# Patient Record
Sex: Male | Born: 1950 | Race: Black or African American | Hispanic: No | Smoking: Former smoker
Health system: Southern US, Community
[De-identification: ages and names within clinical notes are randomized; demographics above are authoritative.]

## PROBLEM LIST (undated history)

## (undated) DIAGNOSIS — E119 Type 2 diabetes mellitus without complications: Secondary | ICD-10-CM

## (undated) DIAGNOSIS — K219 Gastro-esophageal reflux disease without esophagitis: Secondary | ICD-10-CM

## (undated) DIAGNOSIS — G709 Myoneural disorder, unspecified: Secondary | ICD-10-CM

## (undated) DIAGNOSIS — M1990 Unspecified osteoarthritis, unspecified site: Secondary | ICD-10-CM

## (undated) DIAGNOSIS — K862 Cyst of pancreas: Secondary | ICD-10-CM

## (undated) DIAGNOSIS — E785 Hyperlipidemia, unspecified: Secondary | ICD-10-CM

## (undated) DIAGNOSIS — R0600 Dyspnea, unspecified: Secondary | ICD-10-CM

## (undated) DIAGNOSIS — E291 Testicular hypofunction: Secondary | ICD-10-CM

## (undated) DIAGNOSIS — D649 Anemia, unspecified: Secondary | ICD-10-CM

## (undated) DIAGNOSIS — G4730 Sleep apnea, unspecified: Secondary | ICD-10-CM

## (undated) DIAGNOSIS — I10 Essential (primary) hypertension: Secondary | ICD-10-CM

## (undated) DIAGNOSIS — T7840XA Allergy, unspecified, initial encounter: Secondary | ICD-10-CM

## (undated) DIAGNOSIS — G8929 Other chronic pain: Secondary | ICD-10-CM

## (undated) DIAGNOSIS — F333 Major depressive disorder, recurrent, severe with psychotic symptoms: Secondary | ICD-10-CM

## (undated) HISTORY — PX: CATARACT EXTRACTION, BILATERAL: SHX1313

## (undated) HISTORY — DX: Cyst of pancreas: K86.2

## (undated) HISTORY — PX: CHOLECYSTECTOMY: SHX55

## (undated) HISTORY — DX: Testicular hypofunction: E29.1

## (undated) HISTORY — PX: OTHER SURGICAL HISTORY: SHX169

## (undated) HISTORY — DX: Allergy, unspecified, initial encounter: T78.40XA

## (undated) HISTORY — DX: Other chronic pain: G89.29

## (undated) HISTORY — PX: TONSILLECTOMY: SUR1361

## (undated) HISTORY — DX: Essential (primary) hypertension: I10

## (undated) HISTORY — DX: Gastro-esophageal reflux disease without esophagitis: K21.9

## (undated) HISTORY — DX: Type 2 diabetes mellitus without complications: E11.9

## (undated) HISTORY — PX: MENISCUS REPAIR: SHX5179

## (undated) HISTORY — DX: Major depressive disorder, recurrent, severe with psychotic symptoms: F33.3

## (undated) HISTORY — DX: Hyperlipidemia, unspecified: E78.5

---

## 1998-05-05 ENCOUNTER — Ambulatory Visit (HOSPITAL_COMMUNITY): Admission: RE | Admit: 1998-05-05 | Discharge: 1998-05-05 | Payer: Self-pay | Admitting: Cardiology

## 1998-05-20 ENCOUNTER — Ambulatory Visit (HOSPITAL_COMMUNITY): Admission: RE | Admit: 1998-05-20 | Discharge: 1998-05-20 | Payer: Self-pay | Admitting: Cardiology

## 1998-10-12 ENCOUNTER — Encounter: Admission: RE | Admit: 1998-10-12 | Discharge: 1998-10-12 | Payer: Self-pay | Admitting: *Deleted

## 2007-01-03 ENCOUNTER — Encounter: Admission: RE | Admit: 2007-01-03 | Discharge: 2007-01-03 | Payer: Self-pay | Admitting: Orthopedic Surgery

## 2011-11-04 ENCOUNTER — Emergency Department: Payer: Self-pay | Admitting: *Deleted

## 2016-02-29 ENCOUNTER — Other Ambulatory Visit: Payer: Self-pay | Admitting: General Surgery

## 2016-02-29 DIAGNOSIS — D3A00 Benign carcinoid tumor of unspecified site: Secondary | ICD-10-CM

## 2016-06-13 ENCOUNTER — Ambulatory Visit
Admission: RE | Admit: 2016-06-13 | Discharge: 2016-06-13 | Disposition: A | Discharge status: Home / Self Care | Payer: No Typology Code available for payment source | Source: Ambulatory Visit | Attending: General Surgery | Admitting: General Surgery

## 2016-06-13 ENCOUNTER — Other Ambulatory Visit: Payer: Self-pay

## 2016-06-13 DIAGNOSIS — D3A00 Benign carcinoid tumor of unspecified site: Secondary | ICD-10-CM

## 2016-06-13 MED ORDER — IOPAMIDOL (ISOVUE-300) INJECTION 61%
125.0000 mL | Freq: Once | INTRAVENOUS | Status: AC | PRN
  Administered 2016-06-13: 125 mL via INTRAVENOUS

## 2016-06-29 ENCOUNTER — Other Ambulatory Visit: Payer: Self-pay | Admitting: General Surgery

## 2016-06-29 DIAGNOSIS — D3A019 Benign carcinoid tumor of the small intestine, unspecified portion: Secondary | ICD-10-CM

## 2016-10-03 ENCOUNTER — Other Ambulatory Visit: Payer: Self-pay | Admitting: General Surgery

## 2016-10-03 DIAGNOSIS — I10 Essential (primary) hypertension: Secondary | ICD-10-CM

## 2016-10-03 DIAGNOSIS — C7A8 Other malignant neuroendocrine tumors: Secondary | ICD-10-CM

## 2016-10-03 DIAGNOSIS — D3A019 Benign carcinoid tumor of the small intestine, unspecified portion: Secondary | ICD-10-CM

## 2016-12-28 ENCOUNTER — Ambulatory Visit
Admission: RE | Admit: 2016-12-28 | Discharge: 2016-12-28 | Disposition: A | Discharge status: Home / Self Care | Payer: No Typology Code available for payment source | Source: Ambulatory Visit | Attending: General Surgery | Admitting: General Surgery

## 2016-12-28 ENCOUNTER — Other Ambulatory Visit: Payer: No Typology Code available for payment source

## 2016-12-28 DIAGNOSIS — I10 Essential (primary) hypertension: Secondary | ICD-10-CM

## 2016-12-28 DIAGNOSIS — D3A019 Benign carcinoid tumor of the small intestine, unspecified portion: Secondary | ICD-10-CM

## 2016-12-28 DIAGNOSIS — C7A8 Other malignant neuroendocrine tumors: Secondary | ICD-10-CM

## 2016-12-28 MED ORDER — IOPAMIDOL (ISOVUE-300) INJECTION 61%: 125.0000 mL | Freq: Once | INTRAVENOUS | Status: DC | PRN

## 2017-01-23 ENCOUNTER — Other Ambulatory Visit: Payer: Self-pay | Admitting: General Surgery

## 2017-01-23 DIAGNOSIS — D3A019 Benign carcinoid tumor of the small intestine, unspecified portion: Secondary | ICD-10-CM

## 2017-01-23 DIAGNOSIS — C7A8 Other malignant neuroendocrine tumors: Secondary | ICD-10-CM

## 2017-02-06 ENCOUNTER — Other Ambulatory Visit: Payer: No Typology Code available for payment source

## 2017-04-03 ENCOUNTER — Ambulatory Visit
Admission: RE | Admit: 2017-04-03 | Discharge: 2017-04-03 | Disposition: A | Discharge status: Home / Self Care | Payer: No Typology Code available for payment source | Source: Ambulatory Visit | Attending: General Surgery | Admitting: General Surgery

## 2017-04-03 DIAGNOSIS — D3A019 Benign carcinoid tumor of the small intestine, unspecified portion: Secondary | ICD-10-CM

## 2017-04-03 DIAGNOSIS — C7A8 Other malignant neuroendocrine tumors: Secondary | ICD-10-CM

## 2017-04-03 MED ORDER — IOPAMIDOL (ISOVUE-300) INJECTION 61%
125.0000 mL | Freq: Once | INTRAVENOUS | Status: AC | PRN
  Administered 2017-04-03: 125 mL via INTRAVENOUS

## 2017-04-17 ENCOUNTER — Other Ambulatory Visit: Payer: Self-pay | Admitting: General Surgery

## 2017-04-17 DIAGNOSIS — C7A8 Other malignant neuroendocrine tumors: Secondary | ICD-10-CM

## 2017-04-26 ENCOUNTER — Other Ambulatory Visit: Payer: No Typology Code available for payment source

## 2017-05-05 ENCOUNTER — Emergency Department
Admission: EM | Admit: 2017-05-05 | Discharge: 2017-05-05 | Disposition: A | Discharge status: Home / Self Care | Payer: Medicare Other | Attending: Emergency Medicine | Admitting: Emergency Medicine

## 2017-05-05 DIAGNOSIS — Y9289 Other specified places as the place of occurrence of the external cause: Secondary | ICD-10-CM | POA: Insufficient documentation

## 2017-05-05 DIAGNOSIS — L0390 Cellulitis, unspecified: Secondary | ICD-10-CM

## 2017-05-05 DIAGNOSIS — Y939 Activity, unspecified: Secondary | ICD-10-CM | POA: Diagnosis not present

## 2017-05-05 DIAGNOSIS — L03312 Cellulitis of back [any part except buttock]: Secondary | ICD-10-CM | POA: Insufficient documentation

## 2017-05-05 DIAGNOSIS — W57XXXA Bitten or stung by nonvenomous insect and other nonvenomous arthropods, initial encounter: Secondary | ICD-10-CM | POA: Diagnosis not present

## 2017-05-05 DIAGNOSIS — S20469A Insect bite (nonvenomous) of unspecified back wall of thorax, initial encounter: Secondary | ICD-10-CM | POA: Diagnosis present

## 2017-05-05 DIAGNOSIS — Y999 Unspecified external cause status: Secondary | ICD-10-CM | POA: Insufficient documentation

## 2017-05-05 MED ORDER — DOXYCYCLINE HYCLATE 50 MG PO CAPS: 100.0000 mg | ORAL_CAPSULE | Freq: Two times a day (BID) | ORAL | 0 refills | Status: AC

## 2017-05-05 NOTE — ED Provider Notes (Signed)
[facility] Emergency Department Provider Note  ____________________________________________  Time seen: Approximately 5:10 PM  I have reviewed the triage vital signs and the nursing notes.   HISTORY  Chief Complaint Tick removal    HPI Brian Vazquez is a 66 y.o. male that presents to emergency department with tick on his back. Patient is not sure how long the tick has been there. His daughter saw the tick on his back and says that it's really red around the tick. He has been doing a lot of outdoor work with the nice weather. He denies fever, shortness of breath, chest pain, nausea, vomiting, abdominal pain.   No past medical history on file.  There are no active problems to display for this patient.   No past surgical history on file.  Prior to Admission medications   Medication Sig Start Date End Date Taking? Authorizing Provider  doxycycline (VIBRAMYCIN) 50 MG capsule Take 2 capsules (100 mg total) by mouth 2 (two) times daily. 05/05/17 05/15/17  Laban Emperor, PA-C    Allergies Patient has no known allergies.  No family history on file.  Social History Social History  Substance Use Topics  . Smoking status: Not on file  . Smokeless tobacco: Not on file  . Alcohol use Not on file     Review of Systems  Constitutional: No fever/chills Cardiovascular: No chest pain. Respiratory: No SOB. Gastrointestinal: No abdominal pain.  No nausea, no vomiting.  Musculoskeletal: Negative for musculoskeletal pain. Skin: Negative for abrasions, lacerations, ecchymosis. Positive for rash. Neurological: Negative for headaches, numbness or tingling   ____________________________________________   PHYSICAL EXAM:  VITAL SIGNS: ED Triage Vitals [05/05/17 1617]  Enc Vitals Group     BP (!) 141/89     Pulse Rate 95     Resp 18     Temp 97.9 F (36.6 C)     Temp Source Oral     SpO2 98 %     Weight (!) 302 lb (137 kg)     Height 6\' 6"  (1.981  m)     Head Circumference      Peak Flow      Pain Score      Pain Loc      Pain Edu?      Excl. in [place]?      Constitutional: Alert and oriented. Well appearing and in no acute distress. Eyes: Conjunctivae are normal. PERRL. EOMI. Head: Atraumatic. ENT:      Ears:      Nose: No congestion/rhinnorhea.      Mouth/Throat: Mucous membranes are moist.  Neck: No stridor.  Cardiovascular: Normal rate, regular rhythm.  Good peripheral circulation. Respiratory: Normal respiratory effort without tachypnea or retractions. Lungs CTAB. Good air entry to the bases with no decreased or absent breath sounds. Gastrointestinal: Bowel sounds 4 quadrants. Soft and nontender to palpation.  Musculoskeletal: Full range of motion to all extremities. No gross deformities appreciated. Neurologic:  Normal speech and language. No gross focal neurologic deficits are appreciated.  Skin:  Skin is warm, dry and intact. Tick attached to mid right back with 2 inches of surrounding erythema.   ____________________________________________   LABS (all labs ordered are listed, but only abnormal results are displayed)  Labs Reviewed - No data to display ____________________________________________  EKG   ____________________________________________  RADIOLOGY No results found.  ____________________________________________    PROCEDURES  Procedure(s) performed:    Procedures  Tick was removed with forceps.  Medications - No data to  display   ____________________________________________   INITIAL IMPRESSION / ASSESSMENT AND PLAN / ED COURSE  Pertinent labs & imaging results that were available during my care of the patient were reviewed by me and considered in my medical decision making (see chart for details).  Review of the [place] CSRS was performed in accordance of the [place] prior to dispensing any controlled drugs.   Patient's diagnosis is consistent with tick bite and surrounding  cellulitis. Vital signs and exam are reassuring. No appearance of erythema migrans. Tick was removed in ED with forceps. Patient will be discharged home with prescriptions for doxycycline. Patient is to follow up with PCP as directed. Patient is given ED precautions to return to the ED for any worsening or new symptoms.     ____________________________________________  FINAL CLINICAL IMPRESSION(S) / ED DIAGNOSES  Final diagnoses:  Tick bite with subsequent removal of tick  Wound cellulitis      NEW MEDICATIONS STARTED DURING THIS VISIT:  Discharge Medication List as of 05/05/2017  5:17 PM    START taking these medications   Details  doxycycline (VIBRAMYCIN) 50 MG capsule Take 2 capsules (100 mg total) by mouth 2 (two) times daily., Starting Fri 05/05/2017, Until Mon 05/15/2017, Print            This chart was dictated using voice recognition software/Dragon. Despite best efforts to proofread, errors can occur which can change the meaning. Any change was purely unintentional.    Laban Emperor, PA-C 05/05/17 1817    Harvest Dark, MD 05/05/17 [phone]

## 2017-05-05 NOTE — ED Triage Notes (Signed)
Pt states found tick on back today while visiting a family member here today.

## 2017-05-05 NOTE — ED Notes (Signed)
See triage note.   States he discovered a ticka nd is having a hard time removing it

## 2017-08-23 ENCOUNTER — Other Ambulatory Visit: Payer: No Typology Code available for payment source

## 2018-04-04 IMAGING — CT CT CHEST W/ CM
2 of 5 series · 10 of 36 positions shown, 17 images · IV contrast (READICAT/WATER & [ID] ISOVUE 300)
Comparison: None.

CLINICAL DATA: 65-year-old male with history of carcinoid tumor,
status post surgical resection 01/26/2016. Former smoker (quit 30
years ago).

EXAM:
CT CHEST, ABDOMEN, AND PELVIS WITH CONTRAST
TECHNIQUE: Multidetector CT imaging of the chest, abdomen and pelvis was
performed following the standard protocol during bolus
administration of intravenous contrast.
CONTRAST:  125mL 7UPH9L-FMM IOPAMIDOL (7UPH9L-FMM) INJECTION 61%

[Series 601: coronal body · coronal · 1.41mm/px · 1 of 147 slices shown, 2 images]
[im 49/147  soft-tissue]
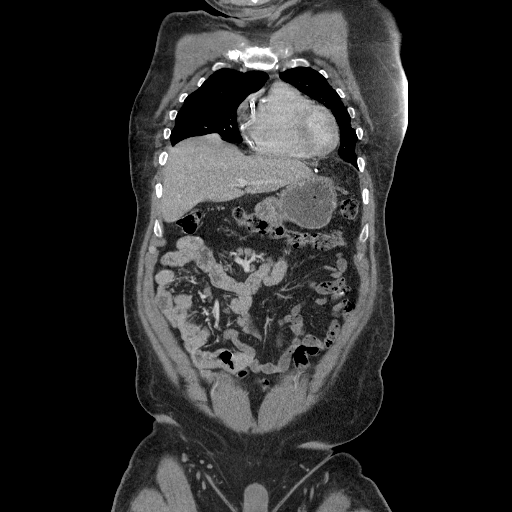
[im 49/147  bone]
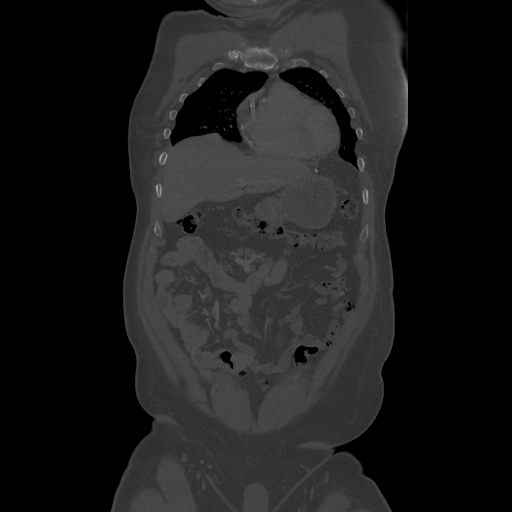

[Series 602: sagittal body · sagittal · 1.41mm/px · 9 of 181 slices shown, 15 images]
[im 17/181  soft-tissue]
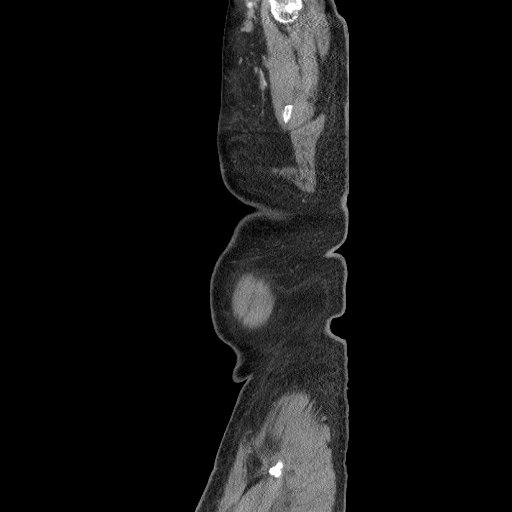
[im 17/181  lung]
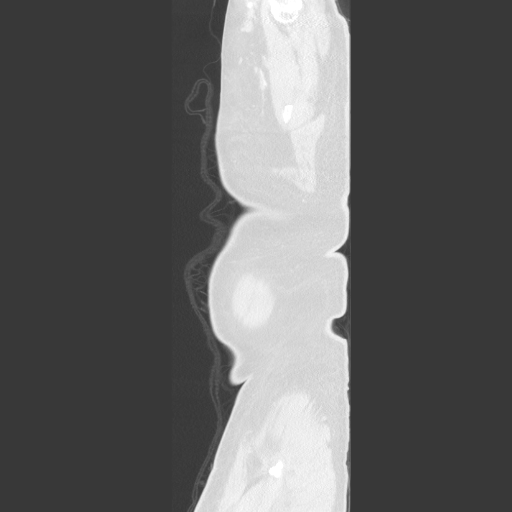
[im 17/181  bone]
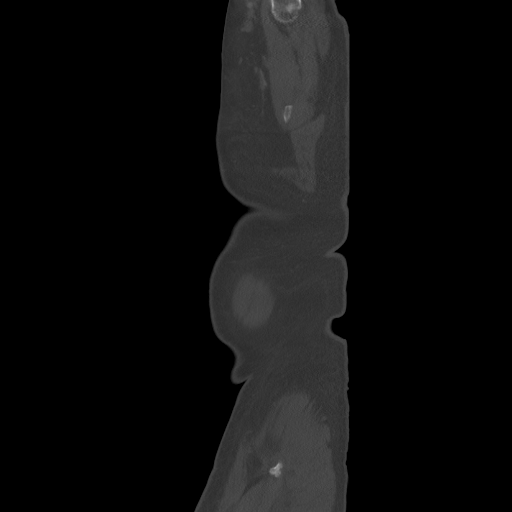
[im 33/181  soft-tissue]
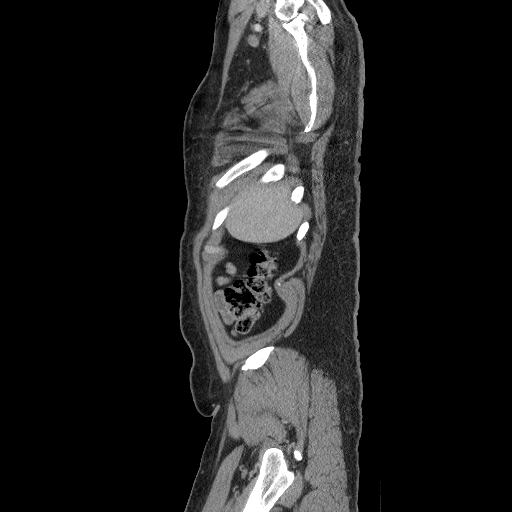
[im 33/181  lung]
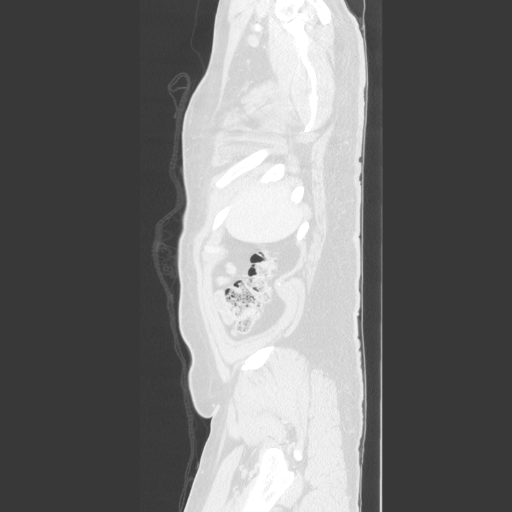
[im 50/181  soft-tissue]
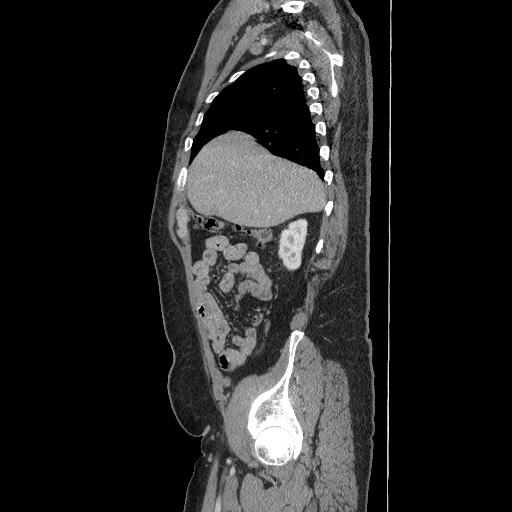
[im 50/181  lung]
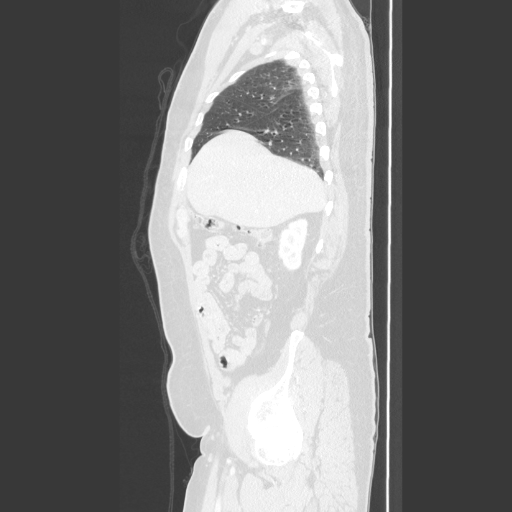
[im 66/181  soft-tissue]
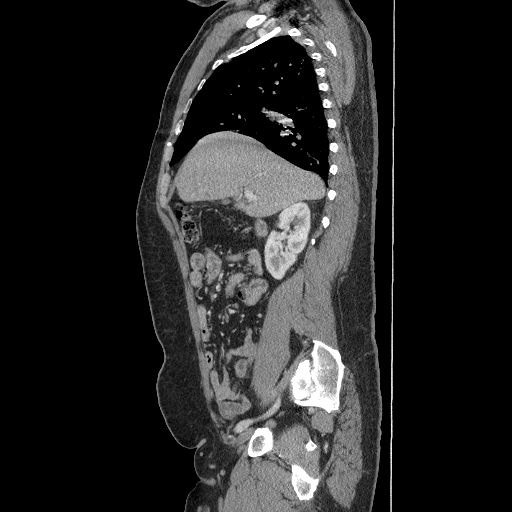
[im 66/181  lung]
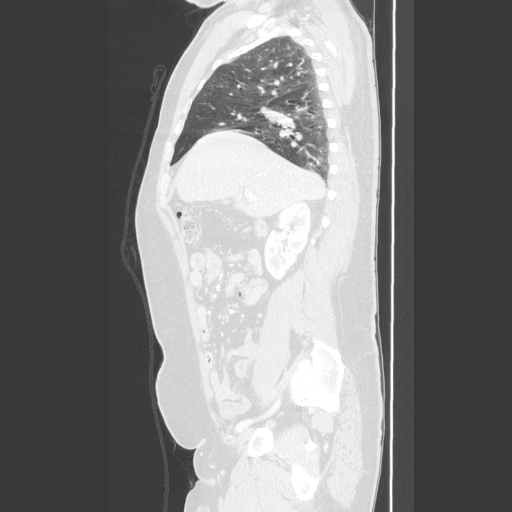
[im 99/181  soft-tissue]
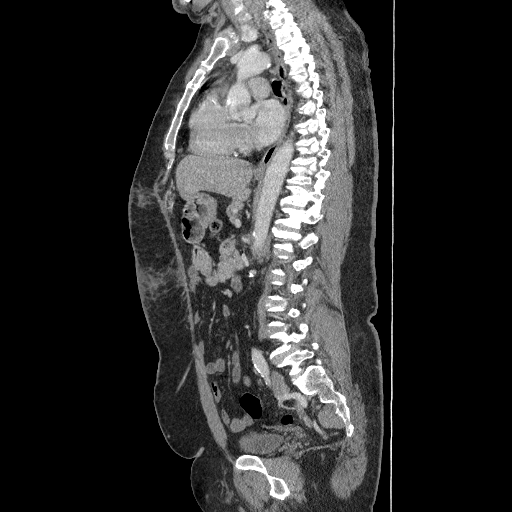
[im 115/181  soft-tissue]
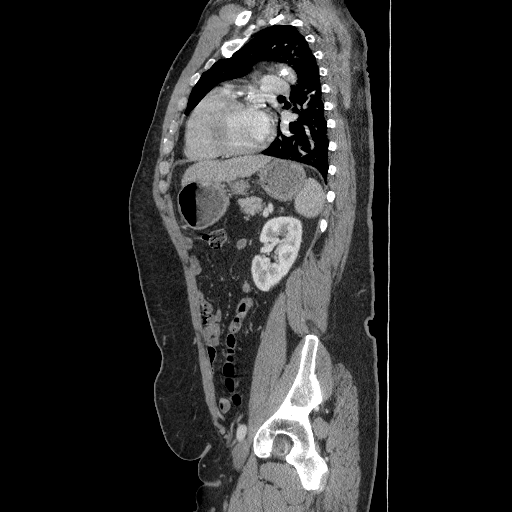
[im 131/181  soft-tissue]
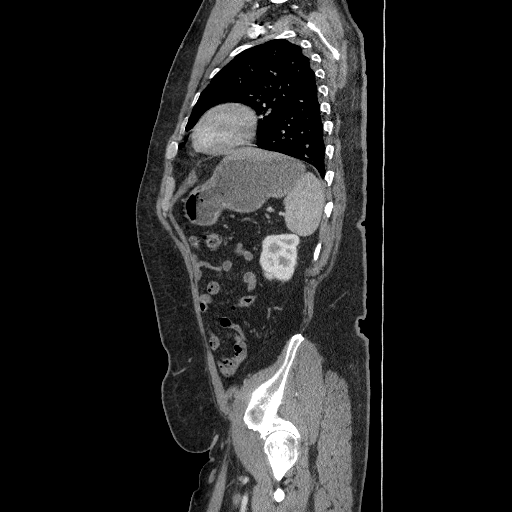
[im 148/181  soft-tissue]
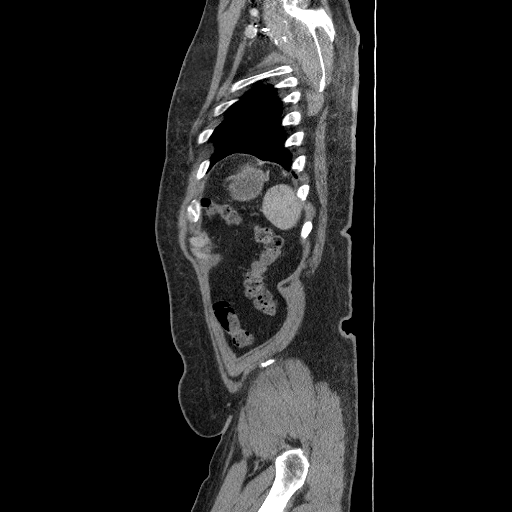
[im 164/181  soft-tissue]
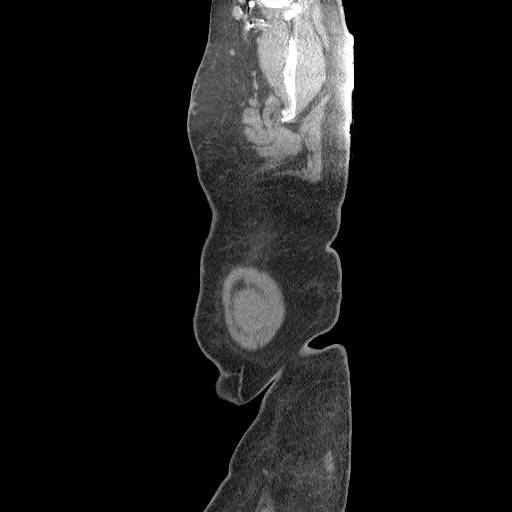
[im 164/181  bone]
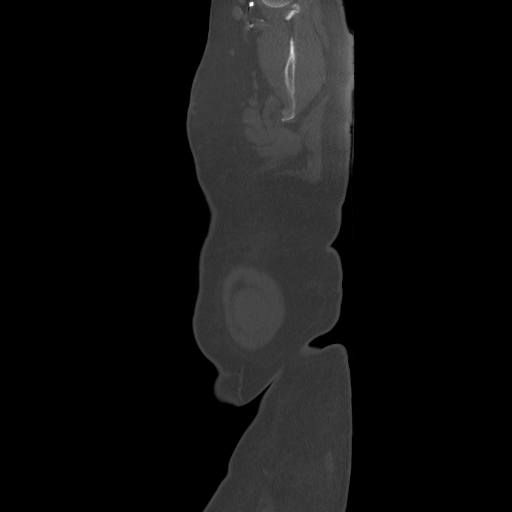

[10 of 36 positions shown; findings below may reference images not displayed]

FINDINGS: Creatinine was obtained on site at [HOSPITAL] at [REDACTED].

Results: Creatinine 1.0 mg/dL.

CT CHEST FINDINGS

Mediastinum/Lymph Nodes: Heart size is normal. There is no
significant pericardial fluid, thickening or pericardial
calcification. There is atherosclerosis of the thoracic aorta, the
great vessels of the mediastinum and the coronary arteries,
including calcified atherosclerotic plaque in the left anterior
descending, left circumflex and right coronary arteries. No
pathologically enlarged mediastinal or hilar lymph nodes. Esophagus
is unremarkable in appearance. No axillary lymphadenopathy.

Lungs/Pleura: No suspicious appearing pulmonary nodules or masses.
No acute consolidative airspace disease. No pleural effusions.
Mosaic attenuation throughout the lungs bilaterally, suggestive of
areas of air trapping from small airways disease.

Musculoskeletal/Soft Tissues: There are no aggressive appearing
lytic or blastic lesions noted in the visualized portions of the
skeleton.

CT ABDOMEN AND PELVIS FINDINGS

Hepatobiliary: 1.2 x 0.6 cm intermediate attenuation (38 HU) lesion
in the central aspect of the liver between segments 4A and 8 (image
55 of series 3) is indeterminate. No other suspicious hepatic
lesions are noted. Gallbladder is not visualized, presumably
surgically absent (no surgical clips are noted in the gallbladder
fossa).

Pancreas: No pancreatic mass. No pancreatic ductal dilatation. No
pancreatic or peripancreatic fluid or inflammatory changes.

Spleen: Unremarkable.

Adrenals/Urinary Tract: The bilateral adrenal glands and bilateral
kidneys are normal in appearance. No hydroureteronephrosis. Urinary
bladder is normal in appearance.

Stomach/Bowel: Postoperative changes of distal gastrectomy and
gastrojejunostomy are noted. No pathologic dilatation of small bowel
or colon. Normal appendix.

Vascular/Lymphatic: Atherosclerosis throughout the abdominal and
pelvic vasculature, without evidence of aneurysm or dissection. No
lymphadenopathy noted in the abdomen or pelvis.

Reproductive: Prostate gland and seminal vesicles are unremarkable
in appearance.

Other: Partially calcified soft tissue attenuation lesion in the
anterior aspect of the epigastric region measuring 1.7 cm in short
axis immediately deep to the healing anterior abdominal wound, most
compatible with a small focus of fat necrosis. No significant volume
of ascites. No pneumoperitoneum.

Musculoskeletal: There are no aggressive appearing lytic or blastic
lesions noted in the visualized portions of the skeleton.
IMPRESSION: 1. No findings to suggest metastatic disease in the chest, abdomen
or pelvis.
2. Postoperative changes of distal gastrectomy and
gastrojejunostomy, as above.
3. Atherosclerosis, including 3 vessel coronary artery disease.
Please note that although the presence of coronary artery calcium
documents the presence of coronary artery disease, the severity of
this disease and any potential stenosis cannot be assessed on this
non-gated CT examination. Assessment for potential risk factor
modification, dietary therapy or pharmacologic therapy may be
warranted, if clinically indicated.
4. Additional incidental findings, as above.

## 2020-10-15 ENCOUNTER — Ambulatory Visit (INDEPENDENT_AMBULATORY_CARE_PROVIDER_SITE_OTHER): Payer: No Typology Code available for payment source | Admitting: Allergy & Immunology

## 2020-10-15 ENCOUNTER — Other Ambulatory Visit: Payer: Self-pay

## 2020-10-15 ENCOUNTER — Encounter: Payer: Self-pay | Admitting: Allergy & Immunology

## 2020-10-15 VITALS — BP 128/80 | HR 93 | Temp 97.4°F | Resp 14 | Ht 78.0 in | Wt 306.2 lb

## 2020-10-15 DIAGNOSIS — J310 Chronic rhinitis: Secondary | ICD-10-CM | POA: Diagnosis not present

## 2020-10-15 DIAGNOSIS — K9049 Malabsorption due to intolerance, not elsewhere classified: Secondary | ICD-10-CM | POA: Insufficient documentation

## 2020-10-15 DIAGNOSIS — K148 Other diseases of tongue: Secondary | ICD-10-CM | POA: Diagnosis not present

## 2020-10-15 NOTE — Patient Instructions (Addendum)
1. Tongue lesion - It looks fairly benign, but I think we need to send you to ENT for an evaluation and possible biopsy. - I do not think that this is related to your cholesterol medicine, but you could talk to your PCP about changing your medicine for a month or two to see if this helps, but I do not think that this is going to make difference.    2. Chronic rhinitis - Testing today showed: negative to the entire panel - Copy of test results provided.  - Avoidance measures provided. - Continue with: Flonase (fluticasone) one spray per nostril daily as needed (AIM FOR THE EARS to limit nose bleeds) - Consider nasal saline rinses 1-2 times daily to remove allergens from the nasal cavities as well as help with mucous clearance (this is especially helpful to do before the nasal sprays are given)  3. Food intolerance - Testing was negative to the most common foods (peanut, sesame, cashew, soy, fish mix, shellfish mix, wheat, milk, casein, egg) - There is a the low positive predictive value of food allergy testing and hence the high possibility of false positives. - In contrast, food allergy testing has a high negative predictive value, therefore if testing is negative we can be relatively assured that they are indeed negative.   4. Itching/skin crawling - This might be related to the multiple medications you are on. - I would talk to your PCP about making your regimen a little simpler.  - It might also help to talk to a pharmacist.   5. Return if symptoms worsen or fail to improve.    Please inform us of any Emergency Department visits, hospitalizations, or changes in symptoms. Call us before going to the ED for breathing or allergy symptoms since we might be able to fit you in for a sick visit. Feel free to contact us anytime with any questions, problems, or concerns.  It was a pleasure to meet you today! Thank you for service to our country!   Websites that have reliable patient  information: 1. American Academy of Asthma, Allergy, and Immunology: www.aaaai.org 2. Food Allergy Research and Education (FARE): foodallergy.org 3. Mothers of Asthmatics: http://www.asthmacommunitynetwork.org 4. American College of Allergy, Asthma, and Immunology: www.acaai.org   COVID-19 Vaccine Information can be found at: [email or web address].co.uk For questions related to vaccine distribution or appointments, please email [email or web address] or call [phone].     "Like" [place] on Facebook and Instagram for our latest updates!     HAPPY FALL!     Make sure you are registered to vote! If you have moved or changed any of your contact information, you will need to get this updated before voting!  In some cases, you MAY be able to register to vote online: [email or web address]

## 2020-10-15 NOTE — Progress Notes (Signed)
NEW PATIENT  Date of Service/Encounter:  10/15/20  Referring provider: Posey Pronto, PA-C   Assessment:   Tongue lesion  Chronic rhinitis  Food intolerance   Ajamu presents for an allergy evaluation. His main complaint seems to be focused on a tongue lesion. It's unclear whether this changes in response to any foods, but we did do testing to the most common foods which were all negative. He also points out that one of his medications can present with tongue swelling, but I reassured him that this warning was concerning anaphylaxis and not a tongue lesion. Regardless we are going to try to get him referred to an ENT for possible biopsy. Environmental allergy testing was negative as well. We did not do intradermal testing since it did not seem that his rhinitis symptoms were all that severe quality of life wise. We will see him again on a PRN basis.   Plan/Recommendations:   1. Tongue lesion - It looks fairly benign, but I think we need to send you to ENT for an evaluation and possible biopsy. - I do not think that this is related to your cholesterol medicine, but you could talk to your PCP about changing your medicine for a month or two to see if this helps, but I do not think that this is going to make difference.    2. Chronic rhinitis - Testing today showed: negative to the entire panel - Copy of test results provided.  - Avoidance measures provided. - Continue with: Flonase (fluticasone) one spray per nostril daily as needed (AIM FOR THE EARS to limit nose bleeds) - Consider nasal saline rinses 1-2 times daily to remove allergens from the nasal cavities as well as help with mucous clearance (this is especially helpful to do before the nasal sprays are given)  3. Food intolerance - Testing was negative to the most common foods (peanut, sesame, cashew, soy, fish mix, shellfish mix, wheat, milk, casein, egg) - There is a the low positive predictive value of food allergy  testing and hence the high possibility of false positives. - In contrast, food allergy testing has a high negative predictive value, therefore if testing is negative we can be relatively assured that they are indeed negative.   4. Itching/skin crawling - This might be related to the multiple medications you are on. - I would talk to your PCP about making your regimen a little simpler.  - It might also help to talk to a pharmacist.   5. Return if symptoms worsen or fail to improve.   Subjective:   Yared Barefoot is a 69 y.o. male presenting today for evaluation of  Chief Complaint  Patient presents with  . Allergy Testing    Wants to know whats he's allergic to. Bump on tongue. Thinks he may be allergic to something he's eaten. Feels like something on his skin. Left leg gets numb and tingles, mostly when he lays on that side at night.    Zymeir Dillinger Aston has a history of the following: Patient Active Problem List   Diagnosis Date Noted  . Tongue lesion 10/15/2020  . Chronic rhinitis 10/15/2020  . Food intolerance 10/15/2020    History obtained from: chart review and patient.  Akio Jhan Conery was referred by Posey Pronto, PA-C.     Kaidyn is a 69 y.o. male presenting for an evaluation of seasonal and food allergey testing.  Patient states that he has a solid lesion on the tip of his tongue.  The lesion has been present for 2 years and will intermittently get bigger. The lesion is associated with occasional pain and bleeding, particularly after accidentally biting it while chewing food. Otherwise it is painless. He denies ETOH or illicit drug use, but does have a 20 pack year history of tobacco use. He has been evaluated be ENT in the past, but did not make note of this lesion. Patient also admits to occasional crawling sensation on his skin. This occurs roughly once a week. He will scarth the area without relief. He denies any associated rash. He has not tried any  over the counter medications to help either of his complaints.   He does not really have allergic rhinitis symptoms.  He will have occasional congestion.  He has had some epistaxis.  In fact, he has had several surgeries to cauterize this.  He does not remember who he saw from an ENT perspective, but this was years ago.  He does have a nasal spray which she does not use on a routine basis.  He only sprays it straight up and not to the sides of the nose.  I did correct that today.  He tolerates all the major food allergens without adverse event.   Otherwise, there is no history of other atopic diseases, including asthma, food allergies, drug allergies, environmental allergies, stinging insect allergies, eczema, urticaria or contact dermatitis. There is no significant infectious history. Unknown vaccinations history but patient states that he is up to date.    Past Medical History: Patient Active Problem List   Diagnosis Date Noted  . Tongue lesion 10/15/2020  . Chronic rhinitis 10/15/2020  . Food intolerance 10/15/2020    Medication List:  Allergies as of 10/15/2020   No Known Allergies     Medication List       Accurate as of October 15, 2020  1:07 PM. If you have any questions, ask your nurse or doctor.        acetaminophen 325 MG tablet Commonly known as: TYLENOL Take 650 mg by mouth every 6 (six) hours as needed.   Aspirin 81 MG Caps Take by mouth.   atorvastatin 80 MG tablet Commonly known as: LIPITOR Take by mouth.   butalbital-acetaminophen-caffeine 50-325-40-30 MG capsule Commonly known as: FIORICET WITH CODEINE Take 3 capsules by mouth every 4 (four) hours as needed for headache.   carboxymethylcellulose 0.5 % Soln Commonly known as: REFRESH PLUS 1 drop 3 (three) times daily as needed.   Cholecal DF 12-3798 MG-UNIT Tabs Generic drug: Folic Acid-Cholecalciferol Take 25 mg by mouth daily. 1071m   docusate sodium 50 MG capsule Commonly known as: COLACE Take  50 mg by mouth 2 (two) times daily.   escitalopram 10 MG tablet Commonly known as: LEXAPRO Take 10 mg by mouth daily.   ezetimibe 10 MG tablet Commonly known as: ZETIA Take 10 mg by mouth daily.   feeding supplement Liqd Take 237 mLs by mouth 2 (two) times daily as needed. As needed   ferrous sulfate 325 (65 FE) MG tablet Take 325 mg by mouth daily with breakfast.   gabapentin 100 MG capsule Commonly known as: NEURONTIN Take by mouth.   Guaifenesin 100 MG Pack Take by mouth.   lisinopril 20 MG tablet Commonly known as: ZESTRIL Take 20 mg by mouth daily.   loratadine 10 MG tablet Commonly known as: CLARITIN Take by mouth.   meloxicam 7.5 MG tablet Commonly known as: MOBIC Take 7.5 mg by mouth daily.   metFORMIN  1000 MG tablet Commonly known as: GLUCOPHAGE Take 1,000 mg by mouth 2 (two) times daily with a meal.   metoCLOPramide 10 MG tablet Commonly known as: REGLAN Take 10 mg by mouth at bedtime.   Omega-3 1000 MG Caps Take by mouth.   pantoprazole 40 MG tablet Commonly known as: PROTONIX Take 40 mg by mouth daily.   testosterone enanthate 200 MG/ML injection Commonly known as: DELATESTRYL Inject into the muscle.   ziprasidone 20 MG capsule Commonly known as: GEODON Take 20 mg by mouth 2 (two) times daily with a meal.       Birth History: non-contributory  Developmental History: Calieb has met all milestones on time. He has required no speech therapy, occupational therapy and physical therapy.   Past Surgical History: No past surgical history on file.   Family History: No family history on file.   Social History: Andy lives at home with his family. They live in a house with hardwood throughout. There is electric heating and central cooling. There are two dogs outside of the home. There are dust mote coverings on the bedding, but not the pillows. 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    Review of Systems  Constitutional: Negative.  Negative for chills, fever, malaise/fatigue and weight loss.  HENT: Positive for congestion. Negative for ear discharge, ear pain and sinus pain.   Eyes: Negative for pain, discharge and redness.  Respiratory: Negative for cough, sputum production, shortness of breath and wheezing.   Cardiovascular: Negative.  Negative for chest pain and palpitations.  Gastrointestinal: Negative for abdominal pain, diarrhea, heartburn, nausea and vomiting.  Skin: Negative.  Negative for itching and rash.  Neurological: Negative for dizziness and headaches.  Endo/Heme/Allergies: Negative for environmental allergies. Does not bruise/bleed easily.    Objective:   Blood pressure 128/80, pulse 93, temperature (!) 97.4 F (36.3 C), resp. rate 14, height _0  (1.981 m), weight (!) 306 lb 3.2 oz (138.9 kg), SpO2 96 %. Body mass index is 35.38 kg/m.   Physical Exam:   Physical Exam Constitutional:      Appearance: Normal appearance.  HENT:     Head: Normocephalic and atraumatic.     Right Ear: External ear normal.     Left Ear: External ear normal.     Nose: Nose normal. No congestion or rhinorrhea.     Mouth/Throat:     Mouth: Mucous membranes are dry.     Comments: 2-3 cm nodular lesion on the tip of his tongue Eyes:     Extraocular Movements: Extraocular movements intact.  Cardiovascular:     Rate and Rhythm: Normal rate.     Pulses: Normal pulses.     Heart sounds: Normal heart sounds.  Pulmonary:     Effort: Pulmonary effort is normal.     Breath sounds: Normal breath sounds.  Musculoskeletal:        General: Normal range of motion.     Cervical back: Normal range of motion.  Skin:    General: Skin is warm and dry.     Findings: No erythema or rash.  Neurological:     General: No focal deficit present.     Mental Status: He is alert.      Diagnostic studies: none  Spirometry: not performed.   Allergy Studies:  negative skin testing    Airborne Adult Perc - 10/15/20 0939    Time  Antigen Placed [id number]    Allergen Manufacturer Greer    Location Back    Number of Test 59    Panel 1 Select    1. Control-Buffer 50% Glycerol Negative    2. Control-Histamine 1 mg/ml 2+    3. Albumin saline Negative    4. [place] Negative    5. [place] Negative    6. Johnson Negative    7. Golf Blue Negative    8. Meadow Fescue Negative    9. Perennial Rye Negative    10. Sweet Vernal Negative    11. Timothy Negative    12. Cocklebur Negative    13. Burweed Marshelder Negative    14. Ragweed, short Negative    15. Ragweed, Giant Negative    16. Plantain,  English Negative    17. Lamb's Quarters Negative    18. Sheep Sorrell Negative    19. Rough Pigweed Negative    20. Marsh Elder, Rough Negative    21. Mugwort, Common Negative    22. Ash mix Negative    23. Birch mix Negative    24. Beech American Negative    25. Box, Elder Negative    26. Cedar, red Negative    27. Cottonwood, [place] Negative    28. Elm mix Negative    29. Hickory Negative    30. Maple mix Negative    31. Oak, [place] mix Negative    32. Pecan Pollen Negative    33. Pine mix Negative    34. Sycamore Eastern Negative    35. [place], Black Pollen Negative    36. Alternaria alternata Negative    37. Cladosporium Herbarum Negative    38. Aspergillus mix Negative    39. Penicillium mix Negative    40. Bipolaris sorokiniana (Helminthosporium) Negative    41. Drechslera spicifera (Curvularia) Negative    42. Mucor plumbeus Negative    43. Fusarium moniliforme Negative    44. Aureobasidium pullulans (pullulara) Negative    45. Rhizopus oryzae Negative    46. Botrytis cinera Negative    47. Epicoccum nigrum Negative    48. Phoma betae Negative    49. Candida Albicans Negative    50. Trichophyton mentagrophytes Negative    51. Mite, D Farinae  5,000 AU/ml Negative    52. Mite, D Pteronyssinus  5,000 AU/ml Negative    53. Cat Hair  10,000 BAU/ml Negative    54.  Dog Epithelia Negative    55. Mixed Feathers Negative    56. Horse Epithelia Negative    57. Cockroach, German Negative    58. Mouse Negative    59. Tobacco Leaf Negative          Food Perc - 10/15/20 0939      Test Information   Time Antigen Placed 8032    Allergen Manufacturer Lavella Hammock    Location Back    Number of allergen test 10    Food Select      Food   1. Peanut Negative    2. Soybean food Negative    3. Wheat, whole Negative    4. Sesame Negative    5. Milk, cow Negative    6. Egg White, chicken Negative    7. Casein Negative    8. Shellfish mix Negative    9. Fish mix Negative    10. Cashew Negative           Allergy testing results were read and interpreted by myself, documented by clinical staff.  Salvatore Marvel, MD [facility] [place]

## 2021-10-01 ENCOUNTER — Telehealth: Payer: Self-pay | Admitting: Allergy & Immunology

## 2021-10-01 NOTE — Telephone Encounter (Signed)
Called patient to verify if he needed a request of authorization sent to the [place], unable to leave message.

## 2023-02-23 ENCOUNTER — Encounter: Payer: Self-pay | Admitting: *Deleted

## 2023-02-24 ENCOUNTER — Ambulatory Visit (INDEPENDENT_AMBULATORY_CARE_PROVIDER_SITE_OTHER): Payer: No Typology Code available for payment source | Admitting: Neurology

## 2023-02-24 ENCOUNTER — Encounter: Payer: Self-pay | Admitting: Neurology

## 2023-02-24 VITALS — BP 103/65 | HR 86 | Ht 78.0 in | Wt 278.0 lb

## 2023-02-24 DIAGNOSIS — M48062 Spinal stenosis, lumbar region with neurogenic claudication: Secondary | ICD-10-CM | POA: Diagnosis not present

## 2023-02-24 DIAGNOSIS — R531 Weakness: Secondary | ICD-10-CM

## 2023-02-24 DIAGNOSIS — R200 Anesthesia of skin: Secondary | ICD-10-CM | POA: Diagnosis not present

## 2023-02-24 DIAGNOSIS — G629 Polyneuropathy, unspecified: Secondary | ICD-10-CM | POA: Diagnosis not present

## 2023-02-24 DIAGNOSIS — R202 Paresthesia of skin: Secondary | ICD-10-CM

## 2023-02-24 NOTE — Progress Notes (Unsigned)
[facility]    Provider:  Dr Jaynee Eagles Requesting Provider: Pearson Forster., * Primary Care Provider:  Cl[facility], [facility]  CC: 72 year old  diabetic patient with neuropathic painin the feet  HPI:  Brian Vazquez is a 72 y.o. male here as requested by Pearson Forster., * for: 72 year old  diabetic patient with neuropathic painin the feet.  Past medical history diabetes mellitus, hypertension, hyperlipidemia, allergies, chronic pain, hypogonadism, patient has not been checking his blood glucose levels at home, obstructive sleep apnea, GERD, pancreatic cyst, obesity, severe recurrent major depression with psychotic features, he has a 40-year pack history of tobacco quit in 1977.  General exam unremarkable.  He has diabetic foot neuropathic pain not controlled on his current Cymbalta regimen nor his prior gabapentin regimen, will consult neurology for further evaluation and treatment options.  Chronic back pain, chronic knee pain.  Reviewed 28 pages from the [place]: He has numbness and tingling and burning sensation in his bilateral feet which is not responding to his current Cymbalta prescription nor did it respond to prior gabapentin prescription.  His feet stay cold, bottom of the feet, both symmetrical, slowly progressive going up the legs, started worsening 4 years ago with worsening diabetes started 20 years ago, diagnosed in 2008. He has low back pain, shoots into your legs. Shoots into his legs to his feet. Sometimes he can hardly walk.  In 2008 he had severe spinal stenosis but no imaging or treatment for his back since then. He can only walk a certain amount of distance before sitting down then starts walking. No on ehas looked at his back since 2008. He has radicular symptoms. He can hardly walk at times. Stiffness in the back. Feels better when bending over on a cart. No bowel or bladder changes. He is wearing his cpap. No ncek pain. He has numbness in the feet  and in the top of the toes and bottom of the feet. No sensory changes in the hands or upper extremities. No significant neck pain and no cervical radicular symptoms. No other focal neurologic deficits, associated symptoms, inciting events or modifiable factors.   Reviewed notes, labs and imaging from outside physicians, which showed:  Bloodwork:  See scanned but BUN/Creat 24/0.9 egfr 61 hgba1c 6.7 has benn diabetic at least 10 years  Clinical Data: Low back pain. Left hip and leg pain.   MRI LUMBAR SPINE WITHOUT CONTRAST:  Technique: Multiplanar and multiecho pulse sequences of the lumbar spine, to include the lower thoracic and upper sacral regions, were obtained according to standard protocol without IV contrast.  Findings: There is no abnormality at T12-L1, L1-2, or L2-3.   At L3-4, the disc bulges minimally but there is no herniation or stenosis.   At L4-5, there are bilateral pars intra-articularis defects with anterolisthesis of 37m. There is a broad base disc herniation. These factors combine to result in severe stenosis. The neural foramina are also stenotic.   At L5-S1, the disc is degenerated with posterior annular tearing and a shallow protrusion that contacts the thecal sac and the S1 nerve roots as they bud from the thecal sac but does not grossly compress them.   IMPRESSION:   1. 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 Severe spinal stenosis and neural foraminal stenosis that could certainly be the cause of the patient's symptoms.   2. Posterior annular tearing and shallow protrusion at L5-S1 which contacts  the thecal sac and the S1 nerve roots as they bud from the thecal sac but does not grossly compress or displace them.   Review of Systems: Patient complains of symptoms per HPI as well as the following symptoms low back pain. Pertinent negatives and positives per HPI. All others negative.   Social History    Socioeconomic History   Marital status: Divorced    Spouse name: Not on file   Number of children: Not on file   Years of education: Not on file   Highest education level: Not on file  Occupational History   Not on file  Tobacco Use   Smoking status: Former    Years: 20.00    Types: Cigarettes    Quit date: 30    Years since quitting: 47.2   Smokeless tobacco: Never   Tobacco comments:    1-2 ppd x 20 years  Substance and Sexual Activity   Alcohol use: Not Currently   Drug use: Never   Sexual activity: Not on file  Other Topics Concern   Not on file  Social History Narrative   Right handed   Caffeine: coffee 5-6 cups/day   Lives alone   Social Determinants of Health   Financial Resource Strain: Not on file  Food Insecurity: Not on file  Transportation Needs: Not on file  Physical Activity: Not on file  Stress: Not on file  Social Connections: Not on file  Intimate Partner Violence: Not on file    Family History  Problem Relation Age of Onset   Diabetes Mother    Diabetes Brother    Neuropathy Neg Hx     Past Medical History:  Diagnosis Date   Allergies    Chronic pain    Diabetes mellitus ([place])    GERD (gastroesophageal reflux disease)    HTN (hypertension)    Hyperlipidemia    Hypogonadism in male    Pancreatic cyst    Severe recurrent major depression with psychotic features [facility])     Patient Active Problem List   Diagnosis Date Noted   Tongue lesion 10/15/2020   Chronic rhinitis 10/15/2020   Food intolerance 10/15/2020    Past Surgical History:  Procedure Laterality Date   CATARACT EXTRACTION, BILATERAL     CHOLECYSTECTOMY     patient states it wasn't removed but "I just don't have one". states he has a little piece but not a full gallbladder.   gastroduodenectomy with Billroth II anastomosis     MENISCUS REPAIR Right    arthroscopic   TONSILLECTOMY      Current Outpatient Medications  Medication Sig Dispense Refill    acetaminophen (TYLENOL) 500 MG tablet TAKE TWO TABLETS BY MOUTH THREE TIMES A DAY AS NEEDED FOR PAIN     Aspirin 81 MG CAPS Take 81 mg by mouth daily.     atorvastatin (LIPITOR) 80 MG tablet Take 80 mg by mouth at bedtime.     butalbital-acetaminophen-caffeine (FIORICET WITH CODEINE) 50-325-40-30 MG capsule Take 2 capsules by mouth every 6 (six) hours as needed for headache.     carboxymethylcellulose (REFRESH PLUS) 0.5 % SOLN 1 drop 4 (four) times daily as needed.     Cholecalciferol (VITAMIN D3) 50 MCG (2000 UT) CAPS Take by mouth.     Cobalamin Combinations (NEURIVA PLUS PO) Take by mouth.     DULoxetine (CYMBALTA) 60 MG capsule Take 60 mg by mouth daily.     escitalopram (LEXAPRO) 10 MG tablet Take 10 mg by mouth daily.  ezetimibe (ZETIA) 10 MG tablet Take 10 mg by mouth daily.     feeding supplement (ENSURE IMMUNE HEALTH) LIQD Take 237 mLs by mouth 2 (two) times daily.     ferrous sulfate 325 (65 FE) MG tablet Take 325 mg by mouth daily with breakfast.     fluticasone (FLONASE) 50 MCG/ACT nasal spray Place 2 sprays into both nostrils daily.     Folic Acid-Cholecalciferol (CHOLECAL DF) 12-3798 MG-UNIT TABS Take 25 mg by mouth daily. '1000mg'$      Guaifenesin 100 MG PACK Take by mouth. 2 tsp every 6 hours as needed     lisinopril-hydrochlorothiazide (ZESTORETIC) 20-25 MG tablet Take 1 tablet by mouth daily.     loratadine (CLARITIN) 10 MG tablet Take 10 mg by mouth daily as needed.     losartan (COZAAR) 25 MG tablet Take 12.5 mg by mouth daily.     meloxicam (MOBIC) 7.5 MG tablet Take 7.5 mg by mouth 2 (two) times daily.     metFORMIN (GLUCOPHAGE) 500 MG tablet TAKE TWO TABLETS BY MOUTH IN THE MORNING AND TAKE ONE TABLET AT NOON AND TAKE TWO TABLETS EVERY EVENING FOR DIABETES (ANNUAL KIDNEY FUNCTION TESTING IS NEEDED)     metoCLOPramide (REGLAN) 10 MG tablet Take 10 mg by mouth at bedtime.     Omega-3 Fatty Acids (FISH OIL) 1000 MG CAPS Take 3 capsules by mouth daily.     pantoprazole  (PROTONIX) 40 MG tablet Take 40 mg by mouth daily.     Polyethylene Glycol 3350 POWD If needed     senna-docusate (SENOKOT-S) 8.6-50 MG tablet Take 1 tablet by mouth 2 (two) times daily as needed for mild constipation.     tamsulosin (FLOMAX) 0.4 MG CAPS capsule Take 0.4 mg by mouth at bedtime.     ziprasidone (GEODON) 20 MG capsule Take 20 mg by mouth every evening.     sildenafil (VIAGRA) 100 MG tablet Take 100 mg by mouth daily as needed (take 1 hour prior to sexual activity. do not exceed 1 dose per 24 hour period.). (Patient not taking: Reported on 02/24/2023)     testosterone enanthate (DELATESTRYL) 200 MG/ML injection Inject into the muscle. (Patient not taking: Reported on 02/24/2023)     No current facility-administered medications for this visit.    Allergies as of 02/24/2023   (No Known Allergies)    Vitals: BP 103/65 (BP Location: Left Arm, Patient Position: Sitting, Cuff Size: Large)   Pulse 86   Ht '6\' 6"'$  (1.981 m)   Wt 278 lb (126.1 kg)   BMI 32.13 kg/m  Last Weight:  Wt Readings from Last 1 Encounters:  02/24/23 278 lb (126.1 kg)   Last Height:   Ht Readings from Last 1 Encounters:  02/24/23 '6\' 6"'$  (1.981 m)     Physical exam: Exam: Gen: NAD, conversant, well nourised, obese, well groomed                     CV: RRR, no MRG. No Carotid Bruits. No peripheral edema, slightly cool to touch , nontender Eyes: Conjunctivae clear without exudates or hemorrhage  Neuro: Detailed Neurologic Exam  Speech:    Speech is normal; fluent and spontaneous with normal comprehension.  Cognition:    The patient is oriented to person, place, and time;     recent and remote memory intact;     language fluent;     normal attention, concentration,     fund of knowledge Cranial Nerves:  The pupils are equal, round, and reactive to light. Attempted, pupils too small to visualize fundi.  Extraocular movements are intact. Trigeminal sensation is intact and the muscles of mastication  are normal. The face is symmetric. The palate elevates in the midline. Hearing impaired. Voice is normal. Shoulder shrug is normal. The tongue has normal motion without fasciculations.   Coordination: No ataxia or dysmetria noted  Gait:    Uses a cane, antalgic and stopped  Motor Observation:    No asymmetry, no atrophy, and no involuntary movements noted. Some hammer toes.  Tone:    Normal muscle tone.    Posture:    Slightly stooped    Strength: mild dorsiflexion weakness bilateral. Otherwise strength is V/V in the upper and lower limbs.      Sensation: decreased distally in a gradient fashion to pin prick but also in an l5/s1 bilaterally. Vibration impaired at great toes but proprioception intact     Reflex Exam:  DTR's: absent AJs, trace to 1+ patellars. 1+ uppers.       Toes:    The toes are downgoing bilaterally.   Clonus:    Clonus is absent.    Assessment/Plan:  Diabetic polyneuropathy: we will check for other causes; also has MRI lumbar spine - severe spinal stenosis and pinched nerves  Patient has diabetic neuropathy.  Pain can be treated at the [facility]. will order a series of other labs to ensure no other reason - pain can be treated at the [place]. Recommend alpha lipoic acid '600mg'$  bid. Capsaicin gel. Topical lidocaine, also other topical creams can be compounded at the [place]. Can try Lyrica as well and increase Cymbalta. - needs close DM and vascular risk factor  - Likely a combination of spinal stenosis and l5/s1 impingement (2008 had severe spinal stenosis and l5/s1 impingement and says no one has ever adrressed it, he had symptoms of stenosis, l5/s1 radic and neurogenic claudication with severe stenosis): MRI lumbar spine - will see what happens. If no other causes of neuropathy or something pcp can address we will send back to [facility] for treatment (for example B12 deficiency) - If spinal stenosis will need neurosurgery or othopaedics - feet feel slightly cold to touch, recommend  vascular studies per primary care (was a smoker) - do not think emg/ncs will add any new information on top of exam, history and mri lumbar spine, ongoing for many years and slowly progressive no acute clinical complaints   Orders Placed This Encounter  Procedures   MR LUMBAR SPINE WO CONTRAST   B12 and Folate Panel   Methylmalonic acid, serum   Vitamin B1   TSH Rfx on Abnormal to Free T4   Multiple Myeloma Panel (SPEP&IFE w/QIG)   Vitamin B6   HIV Antibody (routine testing w rflx)   RPR   Hepatitis C antibody   Heavy metals, blood   Hemoglobin A1c   Comprehensive metabolic panel   CBC with Differential/Platelets   No orders of the defined types were placed in this encounter.   Cc: Pearson Forster., *,  Clinic, Christean Grief, MD  [facility] [address] [place], [place] [address]  Phone [phone] Fax [phone]  I spent over 60 minutes of face-to-face and non-face-to-face time with patient on the  1. Polyneuropathy   2. Spinal stenosis of lumbar region with neurogenic claudication   3. Weakness   4. Numbness and tingling of both legs    diagnosis.  This  included previsit chart review, lab review, study review, order entry, electronic health record documentation, patient education on the different diagnostic and therapeutic options, counseling and coordination of care, risks and benefits of management, compliance, or risk factor reduction

## 2023-02-24 NOTE — Patient Instructions (Addendum)
Diabetic polyneuropathy: we will check for other causes MRI lumbar spine - severe spinal stenosis and pinched nerves  Peripheral Neuropathy Peripheral neuropathy is a type of nerve damage. It affects nerves that carry signals between the spinal cord and the arms, legs, and the rest of the body (peripheral nerves). It does not affect nerves in the spinal cord or brain. In peripheral neuropathy, one nerve or a group of nerves may be damaged. Peripheral neuropathy is a broad category that includes many specific nerve disorders, like diabetic neuropathy, hereditary neuropathy, and carpal tunnel syndrome. What are the causes? This condition may be caused by: Certain diseases, such as: Diabetes. This is the most common cause of peripheral neuropathy. Autoimmune diseases, such as rheumatoid arthritis and systemic lupus erythematosus. Nerve diseases that are passed from parent to child (inherited). Kidney disease. Thyroid disease. Other causes may include: Nerve injury. Pressure or stress on a nerve that lasts a long time. Lack (deficiency) of B vitamins. This can result from alcoholism, poor diet, or a restricted diet. Infections. Some medicines, such as cancer medicines (chemotherapy). Poisonous (toxic) substances, such as lead and mercury. Too little blood flowing to the legs. In some cases, the cause of this condition is not known. What are the signs or symptoms? Symptoms of this condition depend on which of your nerves is damaged. Symptoms in the legs, hands, and arms can include: Loss of feeling (numbness) in the feet, hands, or both. Tingling in the feet, hands, or both. Burning pain. Very sensitive skin. Weakness. Not being able to move a part of the body (paralysis). Clumsiness or poor coordination. Muscle twitching. Loss of balance. Symptoms in other parts of the body can include: Not being able to control your bladder. Feeling dizzy. Sexual problems. How is this  diagnosed? Diagnosing and finding the cause of peripheral neuropathy can be difficult. Your health care provider will take your medical history and do a physical exam. A neurological exam will also be done. This involves checking things that are affected by your brain, spinal cord, and nerves (nervous system). For example, your health care provider will check your reflexes, how you move, and what you can feel. You may have other tests, such as: Blood tests. Electromyogram (EMG) and nerve conduction tests. These tests check nerve function and how well the nerves are controlling the muscles. Imaging tests, such as a CT scan or MRI, to rule out other causes of your symptoms. Removing a small piece of nerve to be examined in a lab (nerve biopsy). Removing and examining a small amount of the fluid that surrounds the brain and spinal cord (lumbar puncture). How is this treated? Treatment for this condition may involve: Treating the underlying cause of the neuropathy, such as diabetes, kidney disease, or vitamin deficiencies. Stopping medicines that can cause neuropathy, such as chemotherapy. Medicine to help relieve pain. Medicines may include: Prescription or over-the-counter pain medicine. Anti-seizure medicine. Antidepressants. Pain-relieving patches that are applied to painful areas of skin. Surgery to relieve pressure on a nerve or to destroy a nerve that is causing pain. Physical therapy to help improve movement and balance. Devices to help you move around (assistive devices). Follow these instructions at home: Medicines Take over-the-counter and prescription medicines only as told by your health care provider. Do not take any other medicines without first asking your health care provider. Ask your health care provider if the medicine prescribed to you requires you to avoid driving or using machinery. Lifestyle  Do not use any products  that contain nicotine or tobacco. These products  include cigarettes, chewing tobacco, and vaping devices, such as e-cigarettes. Smoking keeps blood from reaching damaged nerves. If you need help quitting, ask your health care provider. Avoid or limit alcohol. Too much alcohol can cause a vitamin B deficiency, and vitamin B is needed for healthy nerves. Eat a healthy diet. This includes: Eating foods that are high in fiber, such as beans, whole grains, and fresh fruits and vegetables. Limiting foods that are high in fat and processed sugars, such as fried or sweet foods. General instructions  If you have diabetes, work closely with your health care provider to keep your blood sugar under control. If you have numbness in your feet: Check every day for signs of injury or infection. Watch for redness, warmth, and swelling. Wear padded socks and comfortable shoes. These help protect your feet. Develop a good support system. Living with peripheral neuropathy can be stressful. Consider talking with a mental health specialist or joining a support group. Use assistive devices and attend physical therapy as told by your health care provider. This may include using a walker or a cane. Keep all follow-up visits. This is important. Where to find more information [employer] of Neurological Disorders: [employer] Contact a health care provider if: You have new signs or symptoms of peripheral neuropathy. You are struggling emotionally from dealing with peripheral neuropathy. Your pain is not well controlled. Get help right away if: You have an injury or infection that is not healing normally. You develop new weakness in an arm or leg. You have fallen or do so frequently. Summary Peripheral neuropathy is when the nerves in the arms or legs are damaged, resulting in numbness, weakness, or pain. There are many causes of peripheral neuropathy, including diabetes, pinched nerves, vitamin deficiencies, autoimmune disease, and hereditary  conditions. Diagnosing and finding the cause of peripheral neuropathy can be difficult. Your health care provider will take your medical history, do a physical exam, and do tests, including blood tests and nerve function tests. Treatment involves treating the underlying cause of the neuropathy and taking medicines to help control pain. Physical therapy and assistive devices may also help. This information is not intended to replace advice given to you by your health care provider. Make sure you discuss any questions you have with your health care provider. Document Revised: 08/17/2021 Document Reviewed: 08/17/2021 Elsevier Patient Education  [place].

## 2023-02-25 LAB — TSH RFX ON ABNORMAL TO FREE T4: TSH: 0.919 u[IU]/mL (ref 0.450–4.500)

## 2023-02-27 ENCOUNTER — Encounter: Payer: Self-pay | Admitting: Neurology

## 2023-02-28 ENCOUNTER — Telehealth: Payer: Self-pay | Admitting: Neurology

## 2023-02-28 NOTE — Telephone Encounter (Signed)
[facility] [id number] exp. 11/24/22-05/23/23 sent to GI [id number]

## 2023-03-04 LAB — CBC WITH DIFFERENTIAL/PLATELET
Basophils Absolute: 0 10*3/uL (ref 0.0–0.2)
Basos: 1 %
EOS (ABSOLUTE): 0 10*3/uL (ref 0.0–0.4)
Eos: 2 %
Hematocrit: 33.8 % — ABNORMAL LOW (ref 37.5–51.0)
Hemoglobin: 11.1 g/dL — ABNORMAL LOW (ref 13.0–17.7)
Immature Grans (Abs): 0 10*3/uL (ref 0.0–0.1)
Immature Granulocytes: 0 %
Lymphocytes Absolute: 1 10*3/uL (ref 0.7–3.1)
Lymphs: 39 %
MCH: 26.7 pg (ref 26.6–33.0)
MCHC: 32.8 g/dL (ref 31.5–35.7)
MCV: 81 fL (ref 79–97)
Monocytes Absolute: 0.3 10*3/uL (ref 0.1–0.9)
Monocytes: 10 %
Neutrophils Absolute: 1.3 10*3/uL — ABNORMAL LOW (ref 1.4–7.0)
Neutrophils: 48 %
Platelets: 236 10*3/uL (ref 150–450)
RBC: 4.16 x10E6/uL (ref 4.14–5.80)
RDW: 13.9 % (ref 11.6–15.4)
WBC: 2.6 10*3/uL — ABNORMAL LOW (ref 3.4–10.8)

## 2023-03-04 LAB — MULTIPLE MYELOMA PANEL, SERUM
Albumin SerPl Elph-Mcnc: 4.4 g/dL (ref 2.9–4.4)
Albumin/Glob SerPl: 1.6 (ref 0.7–1.7)
Alpha 1: 0.2 g/dL (ref 0.0–0.4)
Alpha2 Glob SerPl Elph-Mcnc: 0.7 g/dL (ref 0.4–1.0)
B-Globulin SerPl Elph-Mcnc: 1 g/dL (ref 0.7–1.3)
Gamma Glob SerPl Elph-Mcnc: 1 g/dL (ref 0.4–1.8)
Globulin, Total: 2.8 g/dL (ref 2.2–3.9)
IgA/Immunoglobulin A, Serum: 238 mg/dL (ref 61–437)
IgG (Immunoglobin G), Serum: 1049 mg/dL (ref 603–1613)
IgM (Immunoglobulin M), Srm: 57 mg/dL (ref 15–143)

## 2023-03-04 LAB — HEAVY METALS, BLOOD
Arsenic: 2 ug/L (ref 0–9)
Lead, Blood: <1 ug/dL (ref 0.0–3.4)
Mercury: <1 ug/L (ref 0.0–14.9)

## 2023-03-04 LAB — B12 AND FOLATE PANEL
Folate: >20 ng/mL (ref >3.0)
Vitamin B-12: 482 pg/mL (ref 232–1245)

## 2023-03-04 LAB — COMPREHENSIVE METABOLIC PANEL
ALT: 15 IU/L (ref 0–44)
AST: 23 IU/L (ref 0–40)
Albumin/Globulin Ratio: 2.1 (ref 1.2–2.2)
Albumin: 4.9 g/dL — ABNORMAL HIGH (ref 3.8–4.8)
Alkaline Phosphatase: 35 IU/L — ABNORMAL LOW (ref 44–121)
BUN/Creatinine Ratio: 20 (ref 10–24)
BUN: 18 mg/dL (ref 8–27)
Bilirubin Total: 0.9 mg/dL (ref 0.0–1.2)
CO2: 23 mmol/L (ref 20–29)
Calcium: 10.2 mg/dL (ref 8.6–10.2)
Chloride: 105 mmol/L (ref 96–106)
Creatinine, Ser: 0.89 mg/dL (ref 0.76–1.27)
Globulin, Total: 2.3 g/dL (ref 1.5–4.5)
Glucose: 69 mg/dL — ABNORMAL LOW (ref 70–99)
Potassium: 4.5 mmol/L (ref 3.5–5.2)
Sodium: 144 mmol/L (ref 134–144)
Total Protein: 7.2 g/dL (ref 6.0–8.5)
eGFR: 90 mL/min/{1.73_m2} (ref >59)

## 2023-03-04 LAB — HIV ANTIBODY (ROUTINE TESTING W REFLEX): HIV Screen 4th Generation wRfx: NONREACTIVE

## 2023-03-04 LAB — HEMOGLOBIN A1C
Est. average glucose Bld gHb Est-mCnc: 134 mg/dL
Hgb A1c MFr Bld: 6.3 % — ABNORMAL HIGH (ref 4.8–5.6)

## 2023-03-04 LAB — RPR: RPR Ser Ql: NONREACTIVE

## 2023-03-04 LAB — VITAMIN B6: Vitamin B6: 33 ug/L (ref 3.4–65.2)

## 2023-03-04 LAB — METHYLMALONIC ACID, SERUM: Methylmalonic Acid: 190 nmol/L (ref 0–378)

## 2023-03-04 LAB — VITAMIN B1: Thiamine: 101 nmol/L (ref 66.5–200.0)

## 2023-03-04 LAB — HEPATITIS C ANTIBODY: Hep C Virus Ab: NONREACTIVE

## 2023-03-06 ENCOUNTER — Telehealth: Payer: Self-pay | Admitting: *Deleted

## 2023-03-06 NOTE — Telephone Encounter (Signed)
Spoke to pt and relayed that the lab results HbgA1c was 6.3 likely related to diabetic neuropathy that Dr. Jaynee Eagles and he discussed at this appointment.  Also his WBC count was very low.  Will send to pcp to follow.  Dr. Mancel Bale at the [facility]. Pt verbalized understanding. Sent to him mychart activation code to see results of labs will send via his email address.  [email or web address].

## 2023-03-06 NOTE — Telephone Encounter (Signed)
-----   Message from Melvenia Beam, MD sent at 03/06/2023  8:36 AM EDT ----- Hgbaa1c 6.3 so he likely has a compnent of diabetic neuropathy as we discussed. He has very low white blood cell cound, please forward his note and all his labresults to his pcp again [employer]

## 2023-03-10 ENCOUNTER — Ambulatory Visit
Admission: RE | Admit: 2023-03-10 | Discharge: 2023-03-10 | Disposition: A | Discharge status: Home / Self Care | Payer: Non-veteran care | Source: Ambulatory Visit | Attending: Neurology | Admitting: Neurology

## 2023-03-10 DIAGNOSIS — M48062 Spinal stenosis, lumbar region with neurogenic claudication: Secondary | ICD-10-CM

## 2023-03-10 DIAGNOSIS — R202 Paresthesia of skin: Secondary | ICD-10-CM

## 2023-03-10 DIAGNOSIS — R531 Weakness: Secondary | ICD-10-CM

## 2023-03-14 ENCOUNTER — Telehealth: Payer: Self-pay | Admitting: *Deleted

## 2023-03-14 DIAGNOSIS — M48062 Spinal stenosis, lumbar region with neurogenic claudication: Secondary | ICD-10-CM

## 2023-03-14 NOTE — Telephone Encounter (Signed)
Spoke to patient gave MRI results Gave patient Dr. Cathren Laine recommendation for  Neurosurgery. Pt is agreeable to  referral . Placed order today

## 2023-03-14 NOTE — Telephone Encounter (Signed)
-----   Message from Melvenia Beam, MD sent at 03/14/2023  1:23 PM EDT ----- Discuss with patient he has multi level srthritis and nerve pinching. He really needs to be seen by neurosurgery. If he is ok with it let's refer him to neurosurgery (no one inparticular) for lumbar radiculopathy thanks

## 2023-03-15 ENCOUNTER — Ambulatory Visit: Payer: Non-veteran care | Admitting: Diagnostic Neuroimaging

## 2023-03-20 ENCOUNTER — Telehealth: Payer: Self-pay | Admitting: Neurology

## 2023-03-20 NOTE — Telephone Encounter (Signed)
Referral sent to [facility], phone # [phone]. 

## 2023-04-10 NOTE — Telephone Encounter (Signed)
I spoke with the patient and informed him that authorization must come from the [place], not our office. He told me to call [facility] at [phone] ext. [phone]. Please inform the patient that he must directly contact the [facility] for [facility]. We will be glad to send any supporting documents.

## 2023-04-10 NOTE — Telephone Encounter (Signed)
We received a fax back from [facility] stating that referral and authorization must come directly from the [place], not our office.

## 2023-05-15 NOTE — Telephone Encounter (Signed)
Patient called in and advised that Dr Lucia Gaskins is needing to complete a form [id number] for the authorization to refer to neurosurgery. Form printed from previous auth and brought to POD 4

## 2023-07-10 ENCOUNTER — Other Ambulatory Visit: Payer: Self-pay | Admitting: Neurosurgery

## 2023-07-12 ENCOUNTER — Other Ambulatory Visit: Payer: Self-pay | Admitting: Neurosurgery

## 2023-07-21 ENCOUNTER — Other Ambulatory Visit (HOSPITAL_COMMUNITY): Payer: Medicare PPO

## 2023-07-21 NOTE — Pre-Procedure Instructions (Signed)
Surgical Instructions   Your procedure is scheduled on Tuesday, August 6th. Report to [facility] Main Entrance "A" at 05:30 A.M., then check in with the Admitting office. Any questions or running late day of surgery: call [phone]  Questions prior to your surgery date: call [phone], Monday-Friday, 8am-4pm. If you experience any cold or flu symptoms such as cough, fever, chills, shortness of breath, etc. between now and your scheduled surgery, please notify us at the above number.     Remember:  Do not eat or drink after midnight the night before your surgery    Take these medicines the morning of surgery with A SIP OF WATER  acetaminophen (TYLENOL)  DULoxetine (CYMBALTA)  ezetimibe (ZETIA)  loratadine (CLARITIN)  pantoprazole (PROTONIX)   May take these medicines IF NEEDED:  Hold Aspirin for 1 week prior to surgery.  One week prior to surgery, STOP taking any Aleve, Naproxen, Ibuprofen, Motrin, Advil, Goody's, BC's, all herbal medications, fish oil, meloxicam (MOBIC), and non-prescription vitamins.  WHAT DO I DO ABOUT MY DIABETES MEDICATION?   Do not take metFORMIN (GLUCOPHAGE) the morning of surgery.    HOW TO MANAGE YOUR DIABETES BEFORE AND AFTER SURGERY  Why is it important to control my blood sugar before and after surgery? Improving blood sugar levels before and after surgery helps healing and can limit problems. A way of improving blood sugar control is eating a healthy diet by:  Eating less sugar and carbohydrates  Increasing activity/exercise  Talking with your doctor about reaching your blood sugar goals High blood sugars (greater than 180 mg/dL) can raise your risk of infections and slow your recovery, so you will need to focus on controlling your diabetes during the weeks before surgery. Make sure that the doctor who takes care of your diabetes knows about your planned surgery including the date and location.  How do I manage my blood sugar before  surgery? Check your blood sugar at least 4 times a day, starting 2 days before surgery, to make sure that the level is not too high or low.  Check your blood sugar the morning of your surgery when you wake up and every 2 hours until you get to the Short Stay unit.  If your blood sugar is less than 70 mg/dL, you will need to treat for low blood sugar: Do not take insulin. Treat a low blood sugar (less than 70 mg/dL) with  cup of clear juice (cranberry or apple), 4 glucose tablets, OR glucose gel. Recheck blood sugar in 15 minutes after treatment (to make sure it is greater than 70 mg/dL). If your blood sugar is not greater than 70 mg/dL on recheck, call [phone] for further instructions. Report your blood sugar to the short stay nurse when you get to Short Stay.  If you are admitted to the hospital after surgery: Your blood sugar will be checked by the staff and you will probably be given insulin after surgery (instead of oral diabetes medicines) to make sure you have good blood sugar levels. The goal for blood sugar control after surgery is 80-180 mg/dL.                     Do NOT Smoke (Tobacco/Vaping) for 24 hours prior to your procedure.  If you use a CPAP at night, you may bring your mask/headgear for your overnight stay.   You will be asked to remove any contacts, glasses, piercing's, hearing aid's, dentures/partials prior to surgery. Please bring cases for  these items if needed.    Patients discharged the day of surgery will not be allowed to drive home, and someone needs to stay with them for 24 hours.  SURGICAL WAITING ROOM VISITATION Patients may have no more than 2 support people in the waiting area - these visitors may rotate.   Pre-op nurse will coordinate an appropriate time for 1 ADULT support person, who may not rotate, to accompany patient in pre-op.  Children under the age of 74 must have an adult with them who is not the patient and must remain in the main waiting  area with an adult.  If the patient needs to stay at the hospital during part of their recovery, the visitor guidelines for inpatient rooms apply.  Please refer to the [facility] website for the visitor guidelines for any additional information.   If you received a COVID test during your pre-op visit  it is requested that you wear a mask when out in public, stay away from anyone that may not be feeling well and notify your surgeon if you develop symptoms. If you have been in contact with anyone that has tested positive in the last 10 days please notify you surgeon.      Pre-operative 5 CHG Bathing Instructions   You can play a key role in reducing the risk of infection after surgery. Your skin needs to be as free of germs as possible. You can reduce the number of germs on your skin by washing with CHG (chlorhexidine gluconate) soap before surgery. CHG is an antiseptic soap that kills germs and continues to kill germs even after washing.   DO NOT use if you have an allergy to chlorhexidine/CHG or antibacterial soaps. If your skin becomes reddened or irritated, stop using the CHG and notify one of our RNs at [phone].   Please shower with the CHG soap starting 4 days before surgery using the following schedule:     Please keep in mind the following:  DO NOT shave, including legs and underarms, starting the day of your first shower.   You may shave your face at any point before/day of surgery.  Place clean sheets on your bed the day you start using CHG soap. Use a clean washcloth (not used since being washed) for each shower. DO NOT sleep with pets once you start using the CHG.   CHG Shower Instructions:  If you choose to wash your hair and private area, wash first with your normal shampoo/soap.  After you use shampoo/soap, rinse your hair and body thoroughly to remove shampoo/soap residue.  Turn the water OFF and apply about 3 tablespoons (45 ml) of CHG soap to a CLEAN washcloth.   Apply CHG soap ONLY FROM YOUR NECK DOWN TO YOUR TOES (washing for 3-5 minutes)  DO NOT use CHG soap on face, private areas, open wounds, or sores.  Pay special attention to the area where your surgery is being performed.  If you are having back surgery, having someone wash your back for you may be helpful. Wait 2 minutes after CHG soap is applied, then you may rinse off the CHG soap.  Pat dry with a clean towel  Put on clean clothes/pajamas   If you choose to wear lotion, please use ONLY the CHG-compatible lotions on the back of this paper.   Additional instructions for the day of surgery: DO NOT APPLY any lotions, deodorants, cologne, or perfumes.   Do not bring valuables to the hospital. [facility]  is not responsible for any belongings/valuables. Do not wear nail polish, gel polish, artificial nails, or any other type of covering on natural nails (fingers and toes) Do not wear jewelry or makeup Put on clean/comfortable clothes.  Please brush your teeth.  Ask your nurse before applying any prescription medications to the skin.     CHG Compatible Lotions   Aveeno Moisturizing lotion  Cetaphil Moisturizing Cream  Cetaphil Moisturizing Lotion  Clairol Herbal Essence Moisturizing Lotion, Dry Skin  Clairol Herbal Essence Moisturizing Lotion, Extra Dry Skin  Clairol Herbal Essence Moisturizing Lotion, Normal Skin  Curel Age Defying Therapeutic Moisturizing Lotion with Alpha Hydroxy  Curel Extreme Care Body Lotion  Curel Soothing Hands Moisturizing Hand Lotion  Curel Therapeutic Moisturizing Cream, Fragrance-Free  Curel Therapeutic Moisturizing Lotion, Fragrance-Free  Curel Therapeutic Moisturizing Lotion, Original Formula  Eucerin Daily Replenishing Lotion  Eucerin Dry Skin Therapy Plus Alpha Hydroxy Crme  Eucerin Dry Skin Therapy Plus Alpha Hydroxy Lotion  Eucerin Original Crme  Eucerin Original Lotion  Eucerin Plus Crme Eucerin Plus Lotion  Eucerin TriLipid Replenishing  Lotion  Keri Anti-Bacterial Hand Lotion  Keri Deep Conditioning Original Lotion Dry Skin Formula Softly Scented  Keri Deep Conditioning Original Lotion, Fragrance Free Sensitive Skin Formula  Keri Lotion Fast Absorbing Fragrance Free Sensitive Skin Formula  Keri Lotion Fast Absorbing Softly Scented Dry Skin Formula  Keri Original Lotion  Keri Skin Renewal Lotion Keri Silky Smooth Lotion  Keri Silky Smooth Sensitive Skin Lotion  Nivea Body Creamy Conditioning Oil  Nivea Body Extra Enriched Lotion  Nivea Body Original Lotion  Nivea Body Sheer Moisturizing Lotion Nivea Crme  Nivea Skin Firming Lotion  NutraDerm 30 Skin Lotion  NutraDerm Skin Lotion  NutraDerm Therapeutic Skin Cream  NutraDerm Therapeutic Skin Lotion  ProShield Protective Hand Cream  Provon moisturizing lotion  Please read over the following fact sheets that you were given.

## 2023-07-24 ENCOUNTER — Encounter (HOSPITAL_COMMUNITY)
Admission: RE | Admit: 2023-07-24 | Discharge: 2023-07-24 | Disposition: A | Discharge status: Home / Self Care | Payer: No Typology Code available for payment source | Source: Ambulatory Visit | Attending: Neurosurgery | Admitting: Neurosurgery

## 2023-07-24 ENCOUNTER — Encounter (HOSPITAL_COMMUNITY): Payer: Self-pay

## 2023-07-24 ENCOUNTER — Other Ambulatory Visit: Payer: Self-pay

## 2023-07-24 VITALS — BP 117/93 | HR 86 | Temp 98.7°F | Resp 18 | Ht 78.0 in | Wt 277.7 lb

## 2023-07-24 DIAGNOSIS — R040 Epistaxis: Secondary | ICD-10-CM | POA: Diagnosis not present

## 2023-07-24 DIAGNOSIS — D509 Iron deficiency anemia, unspecified: Secondary | ICD-10-CM | POA: Diagnosis not present

## 2023-07-24 DIAGNOSIS — I10 Essential (primary) hypertension: Secondary | ICD-10-CM | POA: Diagnosis not present

## 2023-07-24 DIAGNOSIS — Z01818 Encounter for other preprocedural examination: Secondary | ICD-10-CM | POA: Diagnosis present

## 2023-07-24 DIAGNOSIS — E785 Hyperlipidemia, unspecified: Secondary | ICD-10-CM | POA: Diagnosis not present

## 2023-07-24 DIAGNOSIS — G4733 Obstructive sleep apnea (adult) (pediatric): Secondary | ICD-10-CM | POA: Insufficient documentation

## 2023-07-24 HISTORY — DX: Sleep apnea, unspecified: G47.30

## 2023-07-24 HISTORY — DX: Dyspnea, unspecified: R06.00

## 2023-07-24 HISTORY — DX: Myoneural disorder, unspecified: G70.9

## 2023-07-24 HISTORY — DX: Anemia, unspecified: D64.9

## 2023-07-24 HISTORY — DX: Unspecified osteoarthritis, unspecified site: M19.90

## 2023-07-24 LAB — CBC
HCT: 31.4 % — ABNORMAL LOW (ref 39.0–52.0)
Hemoglobin: 9.7 g/dL — ABNORMAL LOW (ref 13.0–17.0)
MCH: 24.9 pg — ABNORMAL LOW (ref 26.0–34.0)
MCHC: 30.9 g/dL (ref 30.0–36.0)
MCV: 80.7 fL (ref 80.0–100.0)
Platelets: 232 10*3/uL (ref 150–400)
RBC: 3.89 MIL/uL — ABNORMAL LOW (ref 4.22–5.81)
RDW: 16 % — ABNORMAL HIGH (ref 11.5–15.5)
WBC: 3.3 10*3/uL — ABNORMAL LOW (ref 4.0–10.5)
nRBC: 0 % (ref 0.0–0.2)

## 2023-07-24 LAB — BASIC METABOLIC PANEL
Anion gap: 10 (ref 5–15)
BUN: 18 mg/dL (ref 8–23)
CO2: 25 mmol/L (ref 22–32)
Calcium: 9.6 mg/dL (ref 8.9–10.3)
Chloride: 104 mmol/L (ref 98–111)
Creatinine, Ser: 0.96 mg/dL (ref 0.61–1.24)
GFR, Estimated: >60 mL/min (ref >60)
Glucose, Bld: 83 mg/dL (ref 70–99)
Potassium: 4.4 mmol/L (ref 3.5–5.1)
Sodium: 139 mmol/L (ref 135–145)

## 2023-07-24 LAB — TYPE AND SCREEN
ABO/RH(D): A POS
Antibody Screen: NEGATIVE

## 2023-07-24 LAB — GLUCOSE, CAPILLARY: Glucose-Capillary: 81 mg/dL (ref 70–99)

## 2023-07-24 LAB — SURGICAL PCR SCREEN
MRSA, PCR: NEGATIVE
Staphylococcus aureus: POSITIVE — AB

## 2023-07-24 NOTE — Pre-Procedure Instructions (Signed)
Surgical Instructions  Your procedure is scheduled on Tuesday, August 01, 2023 at 7:30 AM. Report to [facility] Main Entrance "A" at 5:30 AM, then check in with the Admitting office. Any questions or running late day of surgery: call [phone]  Questions prior to your surgery date: call [phone], Monday-Friday, 8am-4pm. If you experience any cold or flu symptoms such as cough, fever, chills, shortness of breath, etc. between now and your scheduled surgery, please notify us at the above number.    Remember:  Do not eat or drink after midnight the night before your surgery   Take these medicines the morning of surgery with A SIP OF WATER  acetaminophen (TYLENOL)  DULoxetine (CYMBALTA)  ezetimibe (ZETIA)  loratadine (CLARITIN)  pantoprazole (PROTONIX)   Hold Aspirin for 1 week prior to surgery per Dr. Maisie Fus.  One week prior to surgery, STOP taking any Aleve, Naproxen, Ibuprofen, Motrin, Advil, Goody's, BC's, all herbal medications, fish oil, meloxicam (MOBIC) and non-prescription vitamins.  WHAT DO I DO ABOUT MY DIABETES MEDICATION?  Do not take metFORMIN (GLUCOPHAGE) the morning of surgery.  HOW TO MANAGE YOUR DIABETES BEFORE AND AFTER SURGERY  Why is it important to control my blood sugar before and after surgery? Improving blood sugar levels before and after surgery helps healing and can limit problems. A way of improving blood sugar control is eating a healthy diet by:  Eating less sugar and carbohydrates  Increasing activity/exercise  Talking with your doctor about reaching your blood sugar goals High blood sugars (greater than 180 mg/dL) can raise your risk of infections and slow your recovery, so you will need to focus on controlling your diabetes during the weeks before surgery. Make sure that the doctor who takes care of your diabetes knows about your planned surgery including the date and location.  How do I manage my blood sugar before surgery? Check your blood  sugar at least 4 times a day, starting 2 days before surgery, to make sure that the level is not too high or low.  Check your blood sugar the morning of your surgery when you wake up and every 2 hours until you get to the Short Stay unit.  If your blood sugar is less than 70 mg/dL, you will need to treat for low blood sugar: Do not take insulin. Treat a low blood sugar (less than 70 mg/dL) with  cup of clear juice (cranberry or apple), 4 glucose tablets, OR glucose gel. Recheck blood sugar in 15 minutes after treatment (to make sure it is greater than 70 mg/dL). If your blood sugar is not greater than 70 mg/dL on recheck, call [phone] for further instructions. Report your blood sugar to the short stay nurse when you get to Short Stay.  If you are admitted to the hospital after surgery: Your blood sugar will be checked by the staff and you will probably be given insulin after surgery (instead of oral diabetes medicines) to make sure you have good blood sugar levels. The goal for blood sugar control after surgery is 80-180 mg/dL.                      You will be asked to remove any contacts, glasses, piercing's, hearing aid's, dentures/partials prior to surgery. Please bring cases for these items if needed.   SURGICAL WAITING ROOM VISITATION Patients may have no more than 2 support people in the waiting area - these visitors may rotate.   Pre-op nurse will coordinate an  appropriate time for 1 ADULT support person, who may not rotate, to accompany patient in pre-op.  Children under the age of 19 must have an adult with them who is not the patient and must remain in the main waiting area with an adult.  If the patient needs to stay at the hospital during part of their recovery, the visitor guidelines for inpatient rooms apply.  Please refer to the [facility] website for the visitor guidelines for any additional information.   If you have been in contact with anyone that has tested  positive for Covid in the last 10 days please notify you surgeon.      Pre-operative 5 CHG Bathing Instructions   You can play a key role in reducing the risk of infection after surgery. Your skin needs to be as free of germs as possible. You can reduce the number of germs on your skin by washing with CHG (chlorhexidine gluconate) soap before surgery. CHG is an antiseptic soap that kills germs and continues to kill germs even after washing.   DO NOT use if you have an allergy to chlorhexidine/CHG or antibacterial soaps. If your skin becomes reddened or irritated, stop using the CHG and notify one of our RNs at [phone].   Please shower with the CHG soap starting 4 days before surgery using the following schedule:     Please keep in mind the following:  DO NOT shave, including legs and underarms, starting the day of your first shower.   You may shave your face at any point before/day of surgery.  Place clean sheets on your bed the day you start using CHG soap. Use a clean washcloth (not used since being washed) for each shower. DO NOT sleep with pets once you start using the CHG.   CHG Shower Instructions:  If you choose to wash your hair and private area, wash first with your normal shampoo/soap.  After you use shampoo/soap, rinse your hair and body thoroughly to remove shampoo/soap residue.  Turn the water OFF and apply about 3 tablespoons (45 ml) of CHG soap to a CLEAN washcloth.  Apply CHG soap ONLY FROM YOUR NECK DOWN TO YOUR TOES (washing for 3-5 minutes)  DO NOT use CHG soap on face, private areas, open wounds, or sores.  Pay special attention to the area where your surgery is being performed.  If you are having back surgery, having someone wash your back for you may be helpful. Wait 2 minutes after CHG soap is applied, then you may rinse off the CHG soap.  Pat dry with a clean towel  Put on clean clothes/pajamas   If you choose to wear lotion, please use ONLY the  CHG-compatible lotions on the back of this paper.   Additional instructions for the day of surgery: DO NOT APPLY any lotions, deodorants, cologne.  Do not bring valuables to the hospital. [facility] is not responsible for any belongings/valuables. Do not wear nail polish, gel polish, artificial nails, or any other type of covering on natural nails (fingers and toes) Do not wear jewelry  Put on clean/comfortable clothes.  Please brush your teeth.  Ask your nurse before applying any prescription medications to the skin.     CHG Compatible Lotions   Aveeno Moisturizing lotion  Cetaphil Moisturizing Cream  Cetaphil Moisturizing Lotion  Clairol Herbal Essence Moisturizing Lotion, Dry Skin  Clairol Herbal Essence Moisturizing Lotion, Extra Dry Skin  Clairol Herbal Essence Moisturizing Lotion, Normal Skin  Curel Age Defying  Therapeutic Moisturizing Lotion with Alpha Hydroxy  Curel Extreme Care Body Lotion  Curel Soothing Hands Moisturizing Hand Lotion  Curel Therapeutic Moisturizing Cream, Fragrance-Free  Curel Therapeutic Moisturizing Lotion, Fragrance-Free  Curel Therapeutic Moisturizing Lotion, Original Formula  Eucerin Daily Replenishing Lotion  Eucerin Dry Skin Therapy Plus Alpha Hydroxy Crme  Eucerin Dry Skin Therapy Plus Alpha Hydroxy Lotion  Eucerin Original Crme  Eucerin Original Lotion  Eucerin Plus Crme Eucerin Plus Lotion  Eucerin TriLipid Replenishing Lotion  Keri Anti-Bacterial Hand Lotion  Keri Deep Conditioning Original Lotion Dry Skin Formula Softly Scented  Keri Deep Conditioning Original Lotion, Fragrance Free Sensitive Skin Formula  Keri Lotion Fast Absorbing Fragrance Free Sensitive Skin Formula  Keri Lotion Fast Absorbing Softly Scented Dry Skin Formula  Keri Original Lotion  Keri Skin Renewal Lotion Keri Silky Smooth Lotion  Keri Silky Smooth Sensitive Skin Lotion  Nivea Body Creamy Conditioning Oil  Nivea Body Extra Enriched Lotion  Nivea Body  Original Lotion  Nivea Body Sheer Moisturizing Lotion Nivea Crme  Nivea Skin Firming Lotion  NutraDerm 30 Skin Lotion  NutraDerm Skin Lotion  NutraDerm Therapeutic Skin Cream  NutraDerm Therapeutic Skin Lotion  ProShield Protective Hand Cream  Provon moisturizing lotion  Please read over the fact sheets that you were given.

## 2023-07-24 NOTE — Progress Notes (Addendum)
[facility]  [facility] [place] Neurologist - Dr. Lucia Gaskins  EKG - 07/24/23  [facility] - [facility] [name]) CPAP - yes  Fasting Blood Sugar - 80's Checks Blood Sugar every other day   Aspirin Instructions: per Dr. Maisie Fus "hold 1 week" - last dose today, 07/24/23   Anesthesia review: yes - Hgb - 9.7  Patient denies shortness of breath, fever, cough and chest pain at PAT appointment   All instructions explained to the patient, with a verbal understanding of the material. Patient agrees to go over the instructions while at home for a better understanding. The opportunity to ask questions was provided.

## 2023-07-25 NOTE — Progress Notes (Signed)
Anesthesia Chart Review:  72 yo male with pertinent hx including NIDDM2, HTN, HLD, OSA on CPAP, recurrent epistaxis.   [facility], chronic conditions stable, no changes to management. He was referred to outside neurosurgeon for LBP and radicular pain.   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 Recent EGD and colonoscopy 05/08/23 showed reactive gastropathy, no dysplasia or malignancy, negative H. Pylori, single hyperplastic colon polyp.  Preop labs reviewed, chronic anemia with Hgb 9.7 (most recent comparison at the [place] was 10.4 on 03/30/23), otherwise unremarkable. DM2 well controlled, A1c 6.1.  EKG 07/25/23: NSR. Rate 78.  Prior cardiovascular exams noted in [place] notes:  Holter Monitor on 10/06/22: Impression: Rare PAC and brief SVT. No major arrhythmia. No symptoms noted.  Stress Test on 11/03/08: Findings: Stress EKG:No significant ST & T changes.no chest pain or arrhythmia Tetrafosmin SPECT images:pending reffered to radiology  IMPRESSION:  1: Vasodilator Persantine EKG: -Non ischemic by EKG Vevelyn Francois     Antionette Poles, PA-C [facility] Short Stay Center/Anesthesiology Phone [phone] 07/25/2023 3:42 PM

## 2023-07-25 NOTE — Anesthesia Preprocedure Evaluation (Addendum)
Anesthesia Evaluation  Patient identified by MRN, date of birth, ID band Patient awake    Reviewed: Allergy & Precautions, NPO status , Patient's Chart, lab work & pertinent test results  Airway Mallampati: II  TM Distance: >3 FB Neck ROM: Full    Dental  (+) Edentulous Lower, Edentulous Upper   Pulmonary sleep apnea and Continuous Positive Airway Pressure Ventilation , former smoker   Pulmonary exam normal        Cardiovascular hypertension, Pt. 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  Reproductive/Obstetrics                             Anesthesia Physical Anesthesia Plan  ASA: 3  Anesthesia Plan: General   Post-op Pain Management:    Induction: Intravenous  PONV Risk Score and Plan: 2 and Ondansetron, Dexamethasone, Midazolam and Treatment may vary due to age or medical condition  Airway Management Planned: Oral ETT  Additional Equipment:   Intra-op Plan:   Post-operative Plan: Extubation in OR  Informed Consent: I have reviewed the patients History and Physical, chart, labs and discussed the procedure including the risks, benefits and alternatives for the proposed anesthesia with the patient or authorized representative who has indicated his/her understanding and acceptance.     Dental advisory given  Plan Discussed with: CRNA  Anesthesia Plan Comments: (PAT note by Antionette Poles, PA-C: 72 yo male with pertinent hx including NIDDM2, HTN, HLD, OSA on CPAP, recurrent epistaxis.   [facility], chronic conditions stable, no changes to management. He was referred to outside neurosurgeon for LBP and radicular pain.   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 Recent EGD and colonoscopy 05/08/23 showed reactive gastropathy, no dysplasia or malignancy, negative H. Pylori, single hyperplastic colon polyp.  Preop labs reviewed, chronic anemia with Hgb 9.7 (most recent comparison at the [place] was 10.4 on 03/30/23), otherwise unremarkable. DM2 well controlled, A1c 6.1.  EKG 07/25/23: NSR. Rate 78.  Prior cardiovascular exams noted in [place] notes:  Holter Monitor on 10/06/22: Impression: Rare PAC and brief SVT. No major arrhythmia. No symptoms noted.  Stress Test on 11/03/08: Findings: Stress EKG:No significant ST & T changes.no chest pain or arrhythmia Tetrafosmin SPECT images:pending reffered to radiology  IMPRESSION:  1: Vasodilator Persantine EKG: -Non ischemic by EKG /persantine    )        Anesthesia Quick Evaluation

## 2023-08-01 ENCOUNTER — Inpatient Hospital Stay (HOSPITAL_COMMUNITY): Payer: No Typology Code available for payment source | Admitting: Physician Assistant

## 2023-08-01 ENCOUNTER — Inpatient Hospital Stay (HOSPITAL_COMMUNITY): Payer: No Typology Code available for payment source

## 2023-08-01 ENCOUNTER — Encounter (HOSPITAL_COMMUNITY)
Admission: RE | Disposition: A | Discharge status: Home / Self Care | Payer: Self-pay | Source: Home / Self Care | Attending: Neurosurgery

## 2023-08-01 ENCOUNTER — Encounter (HOSPITAL_COMMUNITY): Payer: Self-pay

## 2023-08-01 ENCOUNTER — Inpatient Hospital Stay (HOSPITAL_COMMUNITY): Payer: No Typology Code available for payment source | Admitting: Anesthesiology

## 2023-08-01 ENCOUNTER — Inpatient Hospital Stay (HOSPITAL_COMMUNITY)
Admission: RE | Admit: 2023-08-01 | Discharge: 2023-08-02 | DRG: 455 | Disposition: A | Discharge status: Home / Self Care | Payer: No Typology Code available for payment source | Attending: Neurosurgery | Admitting: Neurosurgery

## 2023-08-01 ENCOUNTER — Other Ambulatory Visit: Payer: Self-pay

## 2023-08-01 DIAGNOSIS — Z791 Long term (current) use of non-steroidal anti-inflammatories (NSAID): Secondary | ICD-10-CM | POA: Diagnosis not present

## 2023-08-01 DIAGNOSIS — E785 Hyperlipidemia, unspecified: Secondary | ICD-10-CM | POA: Diagnosis present

## 2023-08-01 DIAGNOSIS — M48061 Spinal stenosis, lumbar region without neurogenic claudication: Secondary | ICD-10-CM | POA: Diagnosis not present

## 2023-08-01 DIAGNOSIS — Z833 Family history of diabetes mellitus: Secondary | ICD-10-CM

## 2023-08-01 DIAGNOSIS — J310 Chronic rhinitis: Secondary | ICD-10-CM | POA: Diagnosis present

## 2023-08-01 DIAGNOSIS — I10 Essential (primary) hypertension: Secondary | ICD-10-CM | POA: Diagnosis present

## 2023-08-01 DIAGNOSIS — Z9841 Cataract extraction status, right eye: Secondary | ICD-10-CM

## 2023-08-01 DIAGNOSIS — E119 Type 2 diabetes mellitus without complications: Secondary | ICD-10-CM

## 2023-08-01 DIAGNOSIS — Z9842 Cataract extraction status, left eye: Secondary | ICD-10-CM

## 2023-08-01 DIAGNOSIS — Z9049 Acquired absence of other specified parts of digestive tract: Secondary | ICD-10-CM

## 2023-08-01 DIAGNOSIS — Z7984 Long term (current) use of oral hypoglycemic drugs: Secondary | ICD-10-CM

## 2023-08-01 DIAGNOSIS — Z87891 Personal history of nicotine dependence: Secondary | ICD-10-CM | POA: Diagnosis not present

## 2023-08-01 DIAGNOSIS — Z79899 Other long term (current) drug therapy: Secondary | ICD-10-CM

## 2023-08-01 DIAGNOSIS — M4316 Spondylolisthesis, lumbar region: Secondary | ICD-10-CM | POA: Diagnosis not present

## 2023-08-01 DIAGNOSIS — K219 Gastro-esophageal reflux disease without esophagitis: Secondary | ICD-10-CM | POA: Diagnosis present

## 2023-08-01 DIAGNOSIS — Z7982 Long term (current) use of aspirin: Secondary | ICD-10-CM | POA: Diagnosis not present

## 2023-08-01 DIAGNOSIS — Z01818 Encounter for other preprocedural examination: Secondary | ICD-10-CM

## 2023-08-01 DIAGNOSIS — M4726 Other spondylosis with radiculopathy, lumbar region: Secondary | ICD-10-CM | POA: Diagnosis present

## 2023-08-01 HISTORY — PX: TRANSFORAMINAL LUMBAR INTERBODY FUSION (TLIF) WITH PEDICLE SCREW FIXATION 1 LEVEL: SHX6141

## 2023-08-01 LAB — GLUCOSE, CAPILLARY
Glucose-Capillary: 97 mg/dL (ref 70–99)
Glucose-Capillary: 87 mg/dL (ref 70–99)
Glucose-Capillary: 140 mg/dL — ABNORMAL HIGH (ref 70–99)
Glucose-Capillary: 200 mg/dL — ABNORMAL HIGH (ref 70–99)

## 2023-08-01 SURGERY — TRANSFORAMINAL LUMBAR INTERBODY FUSION (TLIF) WITH PEDICLE SCREW FIXATION 1 LEVEL
Anesthesia: General

## 2023-08-01 MED ORDER — ONDANSETRON HCL 4 MG/2ML IJ SOLN
INTRAMUSCULAR | Status: DC | PRN
  Administered 2023-08-01: 4 mg via INTRAVENOUS

## 2023-08-01 MED ORDER — DOCUSATE SODIUM 50 MG PO CAPS
50.0000 mg | ORAL_CAPSULE | Freq: Two times a day (BID) | ORAL | Status: DC
  Administered 2023-08-01: 50 mg via ORAL
  Filled 2023-08-01 (×2): qty 1

## 2023-08-01 MED ORDER — METFORMIN HCL 500 MG PO TABS
1000.0000 mg | ORAL_TABLET | Freq: Two times a day (BID) | ORAL | Status: DC
  Administered 2023-08-01 – 2023-08-02 (×2): 1000 mg via ORAL
  Filled 2023-08-01 (×2): qty 2

## 2023-08-01 MED ORDER — DEXTROSE 5 % IV SOLN
INTRAVENOUS | Status: DC | PRN
  Administered 2023-08-01 (×2): 3 g via INTRAVENOUS

## 2023-08-01 MED ORDER — ACETAMINOPHEN 325 MG PO TABS
650.0000 mg | ORAL_TABLET | ORAL | Status: DC | PRN
  Administered 2023-08-02: 650 mg via ORAL
  Filled 2023-08-01: qty 2

## 2023-08-01 MED ORDER — INSULIN ASPART 100 UNIT/ML IJ SOLN: 0.0000 [IU] | INTRAMUSCULAR | Status: DC | PRN

## 2023-08-01 MED ORDER — PANTOPRAZOLE SODIUM 40 MG PO TBEC
40.0000 mg | DELAYED_RELEASE_TABLET | Freq: Every day | ORAL | Status: DC
  Administered 2023-08-01 – 2023-08-02 (×2): 40 mg via ORAL
  Filled 2023-08-01 (×2): qty 1

## 2023-08-01 MED ORDER — DEXAMETHASONE SODIUM PHOSPHATE 10 MG/ML IJ SOLN
INTRAMUSCULAR | Status: DC | PRN
  Administered 2023-08-01: 5 mg via INTRAVENOUS

## 2023-08-01 MED ORDER — CHLORHEXIDINE GLUCONATE CLOTH 2 % EX PADS: 6.0000 | MEDICATED_PAD | Freq: Once | CUTANEOUS | Status: DC

## 2023-08-01 MED ORDER — THROMBIN 5000 UNITS EX SOLR
OROMUCOSAL | Status: DC | PRN
  Administered 2023-08-01: 5 mL via TOPICAL

## 2023-08-01 MED ORDER — ATORVASTATIN CALCIUM 80 MG PO TABS
80.0000 mg | ORAL_TABLET | Freq: Every day | ORAL | Status: DC
  Administered 2023-08-01 – 2023-08-02 (×2): 80 mg via ORAL
  Filled 2023-08-01 (×2): qty 1

## 2023-08-01 MED ORDER — CHLORHEXIDINE GLUCONATE 0.12 % MT SOLN: 15.0000 mL | Freq: Once | OROMUCOSAL | Status: AC

## 2023-08-01 MED ORDER — LIDOCAINE 2% (20 MG/ML) 5 ML SYRINGE
INTRAMUSCULAR | Status: AC
  Filled 2023-08-01: qty 5

## 2023-08-01 MED ORDER — BUPIVACAINE LIPOSOME 1.3 % IJ SUSP
INTRAMUSCULAR | Status: DC | PRN
  Administered 2023-08-01: 20 mL

## 2023-08-01 MED ORDER — PROPOFOL 10 MG/ML IV BOLUS
INTRAVENOUS | Status: AC
  Filled 2023-08-01: qty 20

## 2023-08-01 MED ORDER — ONDANSETRON HCL 4 MG/2ML IJ SOLN: 4.0000 mg | Freq: Once | INTRAMUSCULAR | Status: DC | PRN

## 2023-08-01 MED ORDER — OXYCODONE HCL 5 MG PO TABS
ORAL_TABLET | ORAL | Status: AC
  Filled 2023-08-01: qty 2

## 2023-08-01 MED ORDER — LIDOCAINE-EPINEPHRINE 1 %-1:100000 IJ SOLN
INTRAMUSCULAR | Status: AC
  Filled 2023-08-01: qty 1

## 2023-08-01 MED ORDER — PHENYLEPHRINE HCL-NACL 20-0.9 MG/250ML-% IV SOLN
INTRAVENOUS | Status: AC
  Filled 2023-08-01: qty 250

## 2023-08-01 MED ORDER — PHENOL 1.4 % MT LIQD: 1.0000 | OROMUCOSAL | Status: DC | PRN

## 2023-08-01 MED ORDER — OXYCODONE HCL 5 MG PO TABS
10.0000 mg | ORAL_TABLET | ORAL | Status: DC | PRN
  Administered 2023-08-01 – 2023-08-02 (×5): 10 mg via ORAL
  Filled 2023-08-01 (×4): qty 2

## 2023-08-01 MED ORDER — LOSARTAN POTASSIUM 25 MG PO TABS
12.5000 mg | ORAL_TABLET | Freq: Every day | ORAL | Status: DC
  Administered 2023-08-02: 12.5 mg via ORAL
  Filled 2023-08-01: qty 1

## 2023-08-01 MED ORDER — VANCOMYCIN HCL 1000 MG IV SOLR
INTRAVENOUS | Status: DC | PRN
Start: 2023-08-01 — End: 2023-08-01
  Administered 2023-08-01: 1000 mg

## 2023-08-01 MED ORDER — ORAL CARE MOUTH RINSE: 15.0000 mL | Freq: Once | OROMUCOSAL | Status: AC

## 2023-08-01 MED ORDER — AMISULPRIDE (ANTIEMETIC) 5 MG/2ML IV SOLN: 10.0000 mg | Freq: Once | INTRAVENOUS | Status: DC | PRN

## 2023-08-01 MED ORDER — MENTHOL 3 MG MT LOZG: 1.0000 | LOZENGE | OROMUCOSAL | Status: DC | PRN

## 2023-08-01 MED ORDER — PHENYLEPHRINE 80 MCG/ML (10ML) SYRINGE FOR IV PUSH (FOR BLOOD PRESSURE SUPPORT)
PREFILLED_SYRINGE | INTRAVENOUS | Status: AC
  Filled 2023-08-01: qty 10

## 2023-08-01 MED ORDER — PHENYLEPHRINE 80 MCG/ML (10ML) SYRINGE FOR IV PUSH (FOR BLOOD PRESSURE SUPPORT)
PREFILLED_SYRINGE | INTRAVENOUS | Status: DC | PRN
  Administered 2023-08-01 (×2): 80 ug via INTRAVENOUS
  Administered 2023-08-01: 160 ug via INTRAVENOUS

## 2023-08-01 MED ORDER — HYDROMORPHONE HCL 1 MG/ML IJ SOLN: 0.5000 mg | INTRAMUSCULAR | Status: DC | PRN

## 2023-08-01 MED ORDER — METHOCARBAMOL 500 MG PO TABS
ORAL_TABLET | ORAL | Status: AC
  Filled 2023-08-01: qty 1

## 2023-08-01 MED ORDER — FENTANYL CITRATE (PF) 100 MCG/2ML IJ SOLN
25.0000 ug | INTRAMUSCULAR | Status: DC | PRN
  Administered 2023-08-01 (×3): 50 ug via INTRAVENOUS

## 2023-08-01 MED ORDER — KETOROLAC TROMETHAMINE 15 MG/ML IJ SOLN
7.5000 mg | Freq: Four times a day (QID) | INTRAMUSCULAR | Status: DC
  Administered 2023-08-01 – 2023-08-02 (×3): 7.5 mg via INTRAVENOUS
  Filled 2023-08-01 (×3): qty 1

## 2023-08-01 MED ORDER — ROCURONIUM BROMIDE 10 MG/ML (PF) SYRINGE
PREFILLED_SYRINGE | INTRAVENOUS | Status: AC
  Filled 2023-08-01: qty 10

## 2023-08-01 MED ORDER — FENTANYL CITRATE (PF) 100 MCG/2ML IJ SOLN
INTRAMUSCULAR | Status: AC
  Filled 2023-08-01: qty 2

## 2023-08-01 MED ORDER — THROMBIN 5000 UNITS EX SOLR
CUTANEOUS | Status: AC
  Filled 2023-08-01: qty 5000

## 2023-08-01 MED ORDER — METFORMIN HCL 500 MG PO TABS
500.0000 mg | ORAL_TABLET | Freq: Every day | ORAL | Status: DC
  Administered 2023-08-02: 500 mg via ORAL
  Filled 2023-08-01: qty 1

## 2023-08-01 MED ORDER — LACTATED RINGERS IV SOLN
INTRAVENOUS | Status: DC | PRN
Start: 2023-08-01 — End: 2023-08-01

## 2023-08-01 MED ORDER — FENTANYL CITRATE (PF) 250 MCG/5ML IJ SOLN
INTRAMUSCULAR | Status: AC
  Filled 2023-08-01: qty 5

## 2023-08-01 MED ORDER — OXYCODONE HCL 5 MG PO TABS: 5.0000 mg | ORAL_TABLET | ORAL | Status: DC | PRN

## 2023-08-01 MED ORDER — BUPIVACAINE HCL (PF) 0.5 % IJ SOLN
INTRAMUSCULAR | Status: DC | PRN
  Administered 2023-08-01: 30 mL

## 2023-08-01 MED ORDER — PROPOFOL 10 MG/ML IV BOLUS
INTRAVENOUS | Status: DC | PRN
Start: 2023-08-01 — End: 2023-08-01
  Administered 2023-08-01: 20 mg via INTRAVENOUS
  Administered 2023-08-01: 150 mg via INTRAVENOUS

## 2023-08-01 MED ORDER — LIDOCAINE HCL (CARDIAC) PF 100 MG/5ML IV SOSY
PREFILLED_SYRINGE | INTRAVENOUS | Status: DC | PRN
Start: 2023-08-01 — End: 2023-08-01
  Administered 2023-08-01: 60 mg via INTRATRACHEAL

## 2023-08-01 MED ORDER — ACETAMINOPHEN 650 MG RE SUPP: 650.0000 mg | RECTAL | Status: DC | PRN

## 2023-08-01 MED ORDER — SODIUM CHLORIDE 0.9% FLUSH: 3.0000 mL | INTRAVENOUS | Status: DC | PRN

## 2023-08-01 MED ORDER — 0.9 % SODIUM CHLORIDE (POUR BTL) OPTIME
TOPICAL | Status: DC | PRN
  Administered 2023-08-01: 1000 mL

## 2023-08-01 MED ORDER — CEFAZOLIN IN SODIUM CHLORIDE 3-0.9 GM/100ML-% IV SOLN
INTRAVENOUS | Status: AC
  Filled 2023-08-01: qty 100

## 2023-08-01 MED ORDER — METHOCARBAMOL 500 MG PO TABS
500.0000 mg | ORAL_TABLET | Freq: Four times a day (QID) | ORAL | Status: DC | PRN
  Administered 2023-08-01 – 2023-08-02 (×4): 500 mg via ORAL
  Filled 2023-08-01 (×5): qty 1

## 2023-08-01 MED ORDER — DULOXETINE HCL 30 MG PO CPEP
60.0000 mg | ORAL_CAPSULE | Freq: Every day | ORAL | Status: DC
  Administered 2023-08-01 – 2023-08-02 (×2): 60 mg via ORAL
  Filled 2023-08-01 (×2): qty 2

## 2023-08-01 MED ORDER — ONDANSETRON HCL 4 MG/2ML IJ SOLN: 4.0000 mg | Freq: Four times a day (QID) | INTRAMUSCULAR | Status: DC | PRN

## 2023-08-01 MED ORDER — BUPIVACAINE HCL (PF) 0.5 % IJ SOLN
INTRAMUSCULAR | Status: AC
  Filled 2023-08-01: qty 30

## 2023-08-01 MED ORDER — ONDANSETRON HCL 4 MG/2ML IJ SOLN
INTRAMUSCULAR | Status: AC
  Filled 2023-08-01: qty 2

## 2023-08-01 MED ORDER — SODIUM CHLORIDE 0.9 % IV SOLN: 250.0000 mL | INTRAVENOUS | Status: DC

## 2023-08-01 MED ORDER — LACTATED RINGERS IV SOLN: INTRAVENOUS | Status: DC

## 2023-08-01 MED ORDER — DEXAMETHASONE SODIUM PHOSPHATE 10 MG/ML IJ SOLN
INTRAMUSCULAR | Status: AC
  Filled 2023-08-01: qty 1

## 2023-08-01 MED ORDER — ROCURONIUM BROMIDE 100 MG/10ML IV SOLN
INTRAVENOUS | Status: DC | PRN
  Administered 2023-08-01: 5 mg via INTRAVENOUS
  Administered 2023-08-01: 60 mg via INTRAVENOUS
  Administered 2023-08-01 (×3): 5 mg via INTRAVENOUS

## 2023-08-01 MED ORDER — LIDOCAINE-EPINEPHRINE 1 %-1:100000 IJ SOLN
INTRAMUSCULAR | Status: DC | PRN
  Administered 2023-08-01: 10 mL

## 2023-08-01 MED ORDER — VANCOMYCIN HCL 1000 MG IV SOLR
INTRAVENOUS | Status: AC
  Filled 2023-08-01: qty 20

## 2023-08-01 MED ORDER — POLYETHYLENE GLYCOL 3350 17 G PO PACK: 17.0000 g | PACK | Freq: Every day | ORAL | Status: DC | PRN

## 2023-08-01 MED ORDER — ACETAMINOPHEN 10 MG/ML IV SOLN: 1000.0000 mg | Freq: Once | INTRAVENOUS | Status: DC | PRN

## 2023-08-01 MED ORDER — TAMSULOSIN HCL 0.4 MG PO CAPS
0.4000 mg | ORAL_CAPSULE | Freq: Every day | ORAL | Status: DC
  Administered 2023-08-01: 0.4 mg via ORAL
  Filled 2023-08-01: qty 1

## 2023-08-01 MED ORDER — EZETIMIBE 10 MG PO TABS
10.0000 mg | ORAL_TABLET | Freq: Every day | ORAL | Status: DC
  Administered 2023-08-01 – 2023-08-02 (×2): 10 mg via ORAL
  Filled 2023-08-01 (×2): qty 1

## 2023-08-01 MED ORDER — SUGAMMADEX SODIUM 200 MG/2ML IV SOLN
INTRAVENOUS | Status: DC | PRN
  Administered 2023-08-01: 200 mg via INTRAVENOUS

## 2023-08-01 MED ORDER — FLEET ENEMA 7-19 GM/118ML RE ENEM: 1.0000 | ENEMA | Freq: Once | RECTAL | Status: DC | PRN

## 2023-08-01 MED ORDER — ALBUMIN HUMAN 5 % IV SOLN: INTRAVENOUS | Status: DC | PRN

## 2023-08-01 MED ORDER — SODIUM CHLORIDE 0.9% FLUSH
3.0000 mL | Freq: Two times a day (BID) | INTRAVENOUS | Status: DC
  Administered 2023-08-01: 3 mL via INTRAVENOUS

## 2023-08-01 MED ORDER — PHENYLEPHRINE HCL-NACL 20-0.9 MG/250ML-% IV SOLN
INTRAVENOUS | Status: DC | PRN
  Administered 2023-08-01: 50 ug/min via INTRAVENOUS

## 2023-08-01 MED ORDER — CEFAZOLIN SODIUM 1 G IJ SOLR
INTRAMUSCULAR | Status: AC
  Filled 2023-08-01: qty 30

## 2023-08-01 MED ORDER — METFORMIN HCL 500 MG PO TABS: 500.0000 mg | ORAL_TABLET | ORAL | Status: DC

## 2023-08-01 MED ORDER — BUPIVACAINE LIPOSOME 1.3 % IJ SUSP
INTRAMUSCULAR | Status: AC
  Filled 2023-08-01: qty 20

## 2023-08-01 MED ORDER — FERROUS SULFATE 325 (65 FE) MG PO TABS
325.0000 mg | ORAL_TABLET | Freq: Every day | ORAL | Status: DC
  Administered 2023-08-02: 325 mg via ORAL
  Filled 2023-08-01: qty 1

## 2023-08-01 MED ORDER — CHLORHEXIDINE GLUCONATE 0.12 % MT SOLN
OROMUCOSAL | Status: AC
  Administered 2023-08-01: 15 mL via OROMUCOSAL
  Filled 2023-08-01: qty 15

## 2023-08-01 MED ORDER — FENTANYL CITRATE (PF) 250 MCG/5ML IJ SOLN
INTRAMUSCULAR | Status: DC | PRN
  Administered 2023-08-01: 50 ug via INTRAVENOUS
  Administered 2023-08-01: 150 ug via INTRAVENOUS

## 2023-08-01 MED ORDER — ONDANSETRON HCL 4 MG PO TABS: 4.0000 mg | ORAL_TABLET | Freq: Four times a day (QID) | ORAL | Status: DC | PRN

## 2023-08-01 MED ORDER — CEFAZOLIN IN SODIUM CHLORIDE 3-0.9 GM/100ML-% IV SOLN: 3.0000 g | INTRAVENOUS | Status: DC

## 2023-08-01 MED ORDER — CEFAZOLIN SODIUM-DEXTROSE 2-4 GM/100ML-% IV SOLN
2.0000 g | Freq: Three times a day (TID) | INTRAVENOUS | Status: AC
  Administered 2023-08-01 – 2023-08-02 (×2): 2 g via INTRAVENOUS
  Filled 2023-08-01 (×2): qty 100

## 2023-08-01 MED ORDER — METHOCARBAMOL 1000 MG/10ML IJ SOLN: 500.0000 mg | Freq: Four times a day (QID) | INTRAVENOUS | Status: DC | PRN

## 2023-08-01 SURGICAL SUPPLY — 92 items
ADH SKN CLS APL DERMABOND .7 (GAUZE/BANDAGES/DRESSINGS) ×1
BAG COUNTER SPONGE SURGICOUNT (BAG) ×1 IMPLANT
BAG SPNG CNTER NS LX DISP (BAG) ×1
BASKET BONE COLLECTION (BASKET) IMPLANT
BLADE BONE MILL FINE (MISCELLANEOUS)
BLADE BONE MILL MEDIUM (MISCELLANEOUS) IMPLANT
BLADE CLIPPER SURG (BLADE) IMPLANT
BLADE MILL BN FN STRL DISP (MISCELLANEOUS) IMPLANT
BUR 14 MATCH 3 (BUR) IMPLANT
BUR CARBIDE MATCH 3.0 (BURR) IMPLANT
BUR MATCHSTICK NEURO 3.0 LAGG (BURR) ×1 IMPLANT
BUR MR8 14 BALL 5 (BUR) IMPLANT
BUR PRECISION FLUTE 5.0 (BURR) IMPLANT
BURR 14 MATCH 3 (BUR) ×1
BURR MR8 14 BALL 5 (BUR)
CANISTER SUCT 3000ML PPV (MISCELLANEOUS) ×1 IMPLANT
CNTNR URN SCR LID CUP LEK RST (MISCELLANEOUS) ×1 IMPLANT
CONT SPEC 4OZ STRL OR WHT (MISCELLANEOUS) ×1
COVER BACK TABLE 60X90IN (DRAPES) ×1 IMPLANT
COVERAGE SUPPORT O-ARM STEALTH (MISCELLANEOUS) ×1 IMPLANT
DERMABOND ADVANCED .7 DNX12 (GAUZE/BANDAGES/DRESSINGS) ×1 IMPLANT
DRAIN JACKSON PRATT 10MM FLAT (MISCELLANEOUS) IMPLANT
DRAIN RELI 100 BL SUC LF ST (DRAIN) ×1
DRAPE 3/4 80X56 (DRAPES) ×1 IMPLANT
DRAPE C-ARM 42X72 X-RAY (DRAPES) ×1 IMPLANT
DRAPE C-ARMOR (DRAPES) ×1 IMPLANT
DRAPE LAPAROTOMY 100X72X124 (DRAPES) ×1 IMPLANT
DRAPE MICROSCOPE SLANT 54X150 (MISCELLANEOUS) IMPLANT
DRAPE SHEET LG 3/4 BI-LAMINATE (DRAPES) ×4 IMPLANT
DRSG OPSITE 4X5.5 SM (GAUZE/BANDAGES/DRESSINGS) IMPLANT
DRSG OPSITE POSTOP 3X4 (GAUZE/BANDAGES/DRESSINGS) IMPLANT
DRSG OPSITE POSTOP 4X6 (GAUZE/BANDAGES/DRESSINGS) IMPLANT
DURAPREP 26ML APPLICATOR (WOUND CARE) ×1 IMPLANT
ELECT BLADE INSULATED 4IN (ELECTROSURGICAL) ×1
ELECT BLADE INSULATED 6.5IN (ELECTROSURGICAL) ×1
ELECT REM PT RETURN 9FT ADLT (ELECTROSURGICAL) ×1
ELECTRODE BLADE INSULATED 4IN (ELECTROSURGICAL) IMPLANT
ELECTRODE BLDE INSULATED 6.5IN (ELECTROSURGICAL) ×1 IMPLANT
ELECTRODE REM PT RTRN 9FT ADLT (ELECTROSURGICAL) ×1 IMPLANT
EVACUATOR SILICONE 100CC (DRAIN) IMPLANT
FEE COVERAGE SUPPORT O-ARM (MISCELLANEOUS) ×1 IMPLANT
GAUZE 4X4 16PLY ~~LOC~~+RFID DBL (SPONGE) IMPLANT
GAUZE SPONGE 4X4 12PLY STRL (GAUZE/BANDAGES/DRESSINGS) IMPLANT
GLOVE BIO SURGEON STRL SZ 6.5 (GLOVE) IMPLANT
GLOVE BIO SURGEON STRL SZ7 (GLOVE) ×4 IMPLANT
GLOVE BIOGEL PI IND STRL 7.5 (GLOVE) ×3 IMPLANT
GLOVE ECLIPSE 7.5 STRL STRAW (GLOVE) ×1 IMPLANT
GLOVE EXAM NITRILE XL STR (GLOVE) IMPLANT
GLOVE SURG SS PI 7.0 STRL IVOR (GLOVE) IMPLANT
GOWN STRL REUS W/ TWL LRG LVL3 (GOWN DISPOSABLE) ×1 IMPLANT
GOWN STRL REUS W/ TWL XL LVL3 (GOWN DISPOSABLE) ×2 IMPLANT
GOWN STRL REUS W/TWL 2XL LVL3 (GOWN DISPOSABLE) IMPLANT
GOWN STRL REUS W/TWL LRG LVL3 (GOWN DISPOSABLE) ×3
GOWN STRL REUS W/TWL XL LVL3 (GOWN DISPOSABLE) ×5
GRAFT BONE PROTEIOS SM 1CC (Orthopedic Implant) IMPLANT
HEMOSTAT POWDER KIT SURGIFOAM (HEMOSTASIS) ×1 IMPLANT
KIT BASIN OR (CUSTOM PROCEDURE TRAY) ×1 IMPLANT
KIT TURNOVER KIT B (KITS) ×1 IMPLANT
MARKER SKIN DUAL TIP RULER LAB (MISCELLANEOUS) ×1 IMPLANT
MARKER SPHERE PSV REFLC NDI (MISCELLANEOUS) ×5 IMPLANT
MILL BONE PREP (MISCELLANEOUS) ×1 IMPLANT
NDL HYPO 18GX1.5 BLUNT FILL (NEEDLE) IMPLANT
NDL HYPO 21X1.5 SAFETY (NEEDLE) IMPLANT
NDL HYPO 22X1.5 SAFETY MO (MISCELLANEOUS) ×1 IMPLANT
NDL SPNL 18GX3.5 QUINCKE PK (NEEDLE) IMPLANT
NEEDLE HYPO 18GX1.5 BLUNT FILL (NEEDLE) IMPLANT
NEEDLE HYPO 21X1.5 SAFETY (NEEDLE) IMPLANT
NEEDLE HYPO 22X1.5 SAFETY MO (MISCELLANEOUS) ×1 IMPLANT
NEEDLE SPNL 18GX3.5 QUINCKE PK (NEEDLE) IMPLANT
NS IRRIG 1000ML POUR BTL (IV SOLUTION) ×1 IMPLANT
PACK LAMINECTOMY NEURO (CUSTOM PROCEDURE TRAY) ×1 IMPLANT
PAD ARMBOARD 7.5X6 YLW CONV (MISCELLANEOUS) ×3 IMPLANT
PIN BONE FIX 100 (PIN) IMPLANT
PUTTY GRAFTON DBF 6CC W/DELIVE (Putty) IMPLANT
ROD 45MM SOLERA PREBENT (Rod) IMPLANT
SCREW MAS/SET STERILE 4PK (Screw) IMPLANT
SCREW NANO LOCK SHANK 5.5X45 (Screw) IMPLANT
SCREW SHANK NANO 5.5X40 (Screw) IMPLANT
SCREW SHANK NL TITAN 6.5X45 (Screw) IMPLANT
SPACER OLIF DOME 9X26X9.5 10D (Spacer) IMPLANT
SPIKE FLUID TRANSFER (MISCELLANEOUS) ×1 IMPLANT
SPONGE SURGIFOAM ABS GEL 100 (HEMOSTASIS) IMPLANT
SPONGE T-LAP 4X18 ~~LOC~~+RFID (SPONGE) IMPLANT
SUT MNCRL AB 4-0 PS2 18 (SUTURE) ×1 IMPLANT
SUT VIC AB 0 CT1 18XCR BRD8 (SUTURE) ×1 IMPLANT
SUT VIC AB 0 CT1 8-18 (SUTURE) ×1
SUT VIC AB 2-0 CP2 18 (SUTURE) ×1 IMPLANT
SYR 30ML LL (SYRINGE) ×1 IMPLANT
TOWEL GREEN STERILE (TOWEL DISPOSABLE) ×1 IMPLANT
TOWEL GREEN STERILE FF (TOWEL DISPOSABLE) ×1 IMPLANT
TRAY FOLEY MTR SLVR 16FR STAT (SET/KITS/TRAYS/PACK) ×1 IMPLANT
WATER STERILE IRR 1000ML POUR (IV SOLUTION) ×1 IMPLANT

## 2023-08-01 NOTE — Anesthesia Postprocedure Evaluation (Signed)
Anesthesia Post Note  Patient: Hargun Gillig  Procedure(s) Performed: Transforaminal Lumbar Interbody Fusion Lumbar four-five, Lumbar three-four Posterior Decompression  Bilateral  Foraminotomy Application of O-Arm     Patient location during evaluation: PACU Anesthesia Type: General Level of consciousness: awake Pain management: pain level controlled Vital Signs Assessment: post-procedure vital signs reviewed and stable Respiratory status: spontaneous breathing, nonlabored ventilation and respiratory function stable Cardiovascular status: blood pressure returned to baseline and stable Postop Assessment: no apparent nausea or vomiting Anesthetic complications: no   No notable events documented.  Last Vitals:  Vitals:   08/01/23 1345 08/01/23 1433  BP: (!) 148/84 (!) 155/77  Pulse: 68 69  Resp: 10 20  Temp:  36.8 C  SpO2: 100% 97%    Last Pain:  Vitals:   08/01/23 1800  TempSrc:   PainSc: 4                  Jonalyn Sedlak P Pocahontas Cohenour

## 2023-08-01 NOTE — Op Note (Signed)
PREOP DIAGNOSIS: L4-5 isthmic lumbar spondylolisthesis and L3-5 stenosis   POSTOP DIAGNOSIS:  L4-5 isthmic lumbar spondylolisthesis and L3-5 stenosis   PROCEDURE: 1.  L4-5 lumbar interbody fusion via bilateral transforaminal approach 2. Gill laminectomy, bilateral facetectomies and foraminotomies L4-5 3.  L3-4 posterior lumbar decompression and bilateral medial facetectomies 4.  L4-5 posterolateral arthrodesis 5. Placement of interbody cages L4-5 6. Nonsegmental instrumentation with cortical pedicle screw and rod construct at L4-5 7. Harvest of local autograft 8. Use of morselized allograft 9.  Intraoperative CT and neuronavigation with Medtronic Stealth   SURGEON: Dr. Hoyt Koch, MD   ASSISTANT: Patrici Ranks.  Please note, there were no qualified trainees available to assist with the procedure.  An assistant was required for aid in retraction of the neural elements.   ANESTHESIA: General Endotracheal   EBL: 150 ml   IMPLANTS:  Medtronic 5.5 x 45/ 5.5 x 40 mm screws at L4, 6.5 x 45/5.5 x 45 mm screws at L5 Stryker 26 mm half Dome X cages x 2 at L4-5 45 mm rods x 2 ProteiOs  SPECIMENS: None   DRAINS: None   COMPLICATIONS: None   CONDITION: Stable to PACU   HISTORY: This is a 72 yo M who initially presented to the outpatient clinic with severe mechanical back pain as well as bilateral radiculopathy and neurogenic claudication.  Imaging showed L4-5 isthmic spondylolisthesis with significant instability on flexion and extension.  He had severe bilateral foraminal stenosis at L4-5, as well as severe stenosis at L3-4 from overgrown and calcified ligamentum flavum.Marland Kitchen  Physical therapy and medical therapy failed to control his symptoms which were functionally debilitating.  Treatment options were discussed and the patient elected to proceed with posterior lumbar interbody fusion at L4-5 and L3-4 posterior lumbar decompression. risks, benefits, alternatives, and expected  convalescence were discussed with the patient.  Risks discussed included but were not limited to bleeding, pain, infection, scar, pseudoarthrosis, CSF leak, neurologic deficit, paralysis, and death.  The patient wished to proceed with surgery and informed consent was obtained.   PROCEDURE IN DETAIL: After informed consent was obtained and witnessed, the patient was brought to the operating room. After induction of general anesthesia, the patient was positioned on the operative table in the prone position on a open Jackson table with all pressure points meticulously padded. The skin of the low back was then prepped and draped in the usual sterile fashion.  1% lidocaine with epinephrine was injected in the skin and a timeout was performed.  Preoperative antibiotics were administered.  Incision was made with a 10 blade over the L4-5 interspace as confirmed on C-arm.  Subcutaneous tissue and the fascia was incised with monopolar electrocautery.  The paraspinous muscles were dissected off the lamina in subperiosteal fashion.  Additional dissection continued out laterally, exposing the transverse processes of L4 and L5 bilaterally which were then decorticated.  C-arm x-ray confirmed appropriate levels.  PSIS pin was placed percutaneously and intraoperative CT was performed with O-arm.  Using neuronavigation, navigated drill was used to drill pilot holes for cortical screws at L4 and L5 bilaterally.  Navigated awl tip tap was used to cannulate the pedicles and ball ended feeler confirmed good bony channels.  Navigated shanks were then placed with good purchase at each level.  The interspinous ligament at L3-4 and L4-5 was then removed and Gill laminectomy of the entire L4 lamina and inferior facets as well as the inferior third of the L3 lamina was performed with rongeur's and high-speed drill.  Facetectomies were then performed as well bilaterally at L4-5, with bone harvested for autograft.  The ligamentum flavum was  heavily calcified and significantly infolded at L3-4 and required careful dissection in the epidural space.  Additionally, the pars defect scars were removed and the bilateral foramina.  The bilateral foramina were opened and the disc space was exposed bilaterally.  Discectomy was then performed first on the left side with 15 blade, rongeurs, and various ringed and angled curettes as well as disc shavers using C arm for guidance.  Trials were placed and appropriate interbody implants were selected.  Discectomy was then similarly performed on the right side with rongeur's and various ringed and angled curettes and disc shavers using C-arm guidance.  The interbody space was then packed with ProteiOs as well as autograft and allograft.  Under C-arm guidance, the interbody implants were tamped into place and then expanded under lateral x-ray guidance until snug.  Good reduction of spondylolisthesis and restoration of foraminal height was noted.  The cages were detached.   Meticulous hemostasis was obtained.  Good decompression was confirmed with easy passage of ball ended nerve hook.  Tulip heads were then placed on the screws and rods were placed bilaterally and secured with screw caps and final tightened.  Final AP and lateral C-arm x-rays showed good placement of the implants.  Allograft was placed in the lateral gutters between the transverse processes bilaterally.  A medium JP drain was placed in the subfascial space and tunneled out the skin and secured with a stitch.  PSIS pin was removed.   Wound was irrigated thoroughly.  Exparel mixed with Marcaine was injected into the paraspinous muscles and subcutaneous tissues bilaterally.  Vancomycin powder was sprinkled into the wound.  The fascia was closed with 0 Vicryl stitches.  The dermal layer was closed with 2-0 Vicryl stitches in buried interrupted fashion.  The skin incisions were closed with 4-0 Monocryl subcuticular manner followed by Dermabond.  Sterile  dressings were placed.  Patient was then flipped supine and extubated by the anesthesia service following commands and all 4 extremities.  All counts were correct at the end of surgery.  No complications were noted.

## 2023-08-01 NOTE — Transfer of Care (Signed)
Immediate Anesthesia Transfer of Care Note  Patient: Brian Vazquez  Procedure(s) Performed: Transforaminal Lumbar Interbody Fusion Lumbar four-five, Lumbar three-four Posterior Decompression  Bilateral  Foraminotomy Application of O-Arm  Patient Location: PACU  Anesthesia Type:General  Level of Consciousness: alert   Airway & Oxygen Therapy: Patient Spontanous Breathing and Patient connected to face mask oxygen  Post-op Assessment: Report given to RN and Post -op Vital signs reviewed and stable  Post vital signs: Reviewed  Last Vitals:  Vitals Value Taken Time  BP 142/81 08/01/23 1304  Temp    Pulse 84 08/01/23 1310  Resp 13 08/01/23 1310  SpO2 100 % 08/01/23 1310  Vitals shown include unfiled device data.  Last Pain:  Vitals:   08/01/23 0639  TempSrc:   PainSc: 10-Worst pain ever         Complications: No notable events documented.

## 2023-08-01 NOTE — Progress Notes (Deleted)
Orthopedic Tech Progress Note Patient Details:  Brian Vazquez 10/22/1951 [id number]  Patient stated "he has back brace"   Patient ID: Brian Vazquez, male   DOB: 1951-03-19, 72 y.o.   MRN: [id number]  Brian Vazquez 08/01/2023, 2:55 PM

## 2023-08-01 NOTE — Progress Notes (Signed)
Orthopedic Tech Progress Note Patient Details:  Brian Vazquez Male March 18, 1951 [id number]  Ortho Devices Type of Ortho Device: Lumbar corsett Ortho Device/Splint Location: back Ortho Device/Splint Interventions: Ordered   Post Interventions Patient Tolerated: Well Instructions Provided: Care of device  [name] 08/01/2023, 4:23 PM

## 2023-08-01 NOTE — H&P (Signed)
CC: back pain and leg pain  HPI:     Patient is a 72 y.o. male presents with back and right greater than left leg pain.  He was found to have severe stenosis and spondylolisthesis.    Patient Active Problem List   Diagnosis Date Noted   Tongue lesion 10/15/2020   Chronic rhinitis 10/15/2020   Food intolerance 10/15/2020   Past Medical History:  Diagnosis Date   Allergies    Anemia    Arthritis    Chronic pain    Diabetes mellitus (HCC)    Dyspnea    GERD (gastroesophageal reflux disease)    HTN (hypertension)    Hyperlipidemia    Hypogonadism in male    Neuromuscular disorder (HCC)    neuropathy   Pancreatic cyst    Severe recurrent major depression with psychotic features (HCC)    Sleep apnea    wears cpap    Past Surgical History:  Procedure Laterality Date   CATARACT EXTRACTION, BILATERAL     CHOLECYSTECTOMY     patient states it wasn't removed but "I just don't have one". states he has a little piece but not a full gallbladder.   gastroduodenectomy with Billroth II anastomosis     MENISCUS REPAIR Right    arthroscopic   TONSILLECTOMY      Medications Prior to Admission  Medication Sig Dispense Refill Last Dose   acetaminophen (TYLENOL) 500 MG tablet Take 1,000 mg by mouth 3 (three) times daily.   08/01/2023 at 0300   Aspirin 81 MG CAPS Take 81 mg by mouth daily.   Past Week   atorvastatin (LIPITOR) 80 MG tablet Take 80 mg by mouth at bedtime.   07/31/2023   Cholecalciferol (VITAMIN D3) 50 MCG (2000 UT) CAPS Take 2,000 Units by mouth daily.   Past Week   Cobalamin Combinations (NEURIVA PLUS PO) Take 1 tablet by mouth daily.      docusate sodium (COLACE) 50 MG capsule Take 50 mg by mouth 2 (two) times daily.   07/31/2023   DULoxetine (CYMBALTA) 60 MG capsule Take 60 mg by mouth daily.   07/31/2023   ezetimibe (ZETIA) 10 MG tablet Take 10 mg by mouth daily.   07/31/2023   ferrous sulfate 325 (65 FE) MG tablet Take 325 mg by mouth daily with breakfast.   Past Week    hydrogen peroxide 3 % external solution See admin instructions. Rinse orally when needed for gum sores   Past Week   loratadine (CLARITIN) 10 MG tablet Take 10 mg by mouth daily.   Past Week   losartan (COZAAR) 25 MG tablet Take 12.5 mg by mouth daily at 12 noon.   Past Week   meloxicam (MOBIC) 15 MG tablet Take 15 mg by mouth daily.   Past Week   metFORMIN (GLUCOPHAGE) 500 MG tablet Take 500-1,000 mg by mouth See admin instructions. Take 1000 mg in the morning, take 500 mg at noon, and 1000 mg in the evening   07/31/2023   Omega-3 Fatty Acids (FISH OIL) 1000 MG CAPS Take 2,000 mg by mouth 3 (three) times daily.   Past Week   pantoprazole (PROTONIX) 40 MG tablet Take 40 mg by mouth daily.   Past Week   Polyethylene Glycol 3350 POWD Take 17 g by mouth daily as needed (constipation).   Past Week   tamsulosin (FLOMAX) 0.4 MG CAPS capsule Take 0.4 mg by mouth at bedtime.   Past Week   No Known Allergies  Social History  Tobacco Use   Smoking status: Former    Current packs/day: 0.00    Types: Cigarettes    Start date: 13    Quit date: 1977    Years since quitting: 47.6   Smokeless tobacco: Never   Tobacco comments:    1-2 ppd x 20 years  Substance Use Topics   Alcohol use: Never    Family History  Problem Relation Age of Onset   Diabetes Mother    Diabetes Brother    Neuropathy Neg Hx      Review of Systems Pertinent items are noted in HPI.  Objective:   Patient Vitals for the past 8 hrs:  BP Temp Temp src Pulse Resp SpO2 Height Weight  08/01/23 0551 137/80 97.9 F (36.6 C) Oral 81 16 95 % 6\' 6"  (1.981 m) 121.1 kg   No intake/output data recorded. No intake/output data recorded.      General : Alert, cooperative, no distress, appears stated age   Head:  Normocephalic/atraumatic    Eyes: PERRL, conjunctiva/corneas clear, EOM's intact. Fundi could not be visualized Neck: Supple Chest:  Respirations unlabored Chest wall: no tenderness or deformity Heart: Regular rate  and rhythm Abdomen: Soft, nontender and nondistended Extremities: warm and well-perfused Skin: normal turgor, color and texture Neurologic:  Alert, oriented x 3.  Eyes open spontaneously. PERRL, EOMI, VFC, no facial droop. V1-3 intact.  No dysarthria, tongue protrusion symmetric.  CNII-XII intact. Normal strength, sensation and reflexes throughout.  No pronator drift, full strength in legs. + SLR bilaterally.       Data ReviewCBC:  Lab Results  Component Value Date   WBC 3.3 (L) 07/24/2023   RBC 3.89 (L) 07/24/2023   BMP:  Lab Results  Component Value Date   GLUCOSE 83 07/24/2023   CO2 25 07/24/2023   BUN 18 07/24/2023   BUN 18 02/24/2023   CREATININE 0.96 07/24/2023   CALCIUM 9.6 07/24/2023   Radiology review:   See clinic note  Assessment:   Active Problems:   * No active hospital problems. *  L3-4, L4-5 stenosis, L4-5 spondylolisthesis  Plan:   - plan for L3-4, L4-5 decompression, L4-5 TLIF

## 2023-08-01 NOTE — Anesthesia Procedure Notes (Signed)
Procedure Name: Intubation Date/Time: 08/01/2023 8:06 AM  Performed by: Aris Georgia, CRNAPre-anesthesia Checklist: Patient identified, Emergency Drugs available, Suction available and Patient being monitored Patient Re-evaluated:Patient Re-evaluated prior to induction Oxygen Delivery Method: Circle System Utilized Preoxygenation: Pre-oxygenation with 100% oxygen Induction Type: IV induction Ventilation: Mask ventilation without difficulty Laryngoscope Size: 3 and Miller Grade View: Grade I Tube type: Oral Tube size: 8.0 mm Number of attempts: 1 Airway Equipment and Method: Stylet and Oral airway Placement Confirmation: ETT inserted through vocal cords under direct vision, positive ETCO2 and breath sounds checked- equal and bilateral Secured at: 24 cm Tube secured with: Tape Dental Injury: Teeth and Oropharynx as per pre-operative assessment

## 2023-08-01 NOTE — Progress Notes (Signed)
Pt placed on CPAP for the night.     08/01/23 2210  BiPAP/CPAP/SIPAP  $ Non-Invasive Ventilator  Non-Invasive Vent Set Up  $ Face Mask Medium Yes  BiPAP/CPAP/SIPAP Pt Type Adult  BiPAP/CPAP/SIPAP DREAMSTATIOND  Mask Type Full face mask  Mask Size Medium  EPAP 10 cmH2O  PEEP 10 cmH20  FiO2 (%) 21 %  Patient Home Equipment No  Auto Titrate No  Press High Alarm 25 cmH2O  Nasal massage performed No (comment)  CPAP/SIPAP surface wiped down Yes  BiPAP/CPAP /SiPAP Vitals  Pulse Rate 73  Resp 18  SpO2 98 %  Bilateral Breath Sounds Clear  MEWS Score/Color  MEWS Score 0  MEWS Score Color Brian Vazquez

## 2023-08-02 ENCOUNTER — Other Ambulatory Visit (HOSPITAL_COMMUNITY): Payer: Self-pay

## 2023-08-02 LAB — GLUCOSE, CAPILLARY
Glucose-Capillary: 121 mg/dL — ABNORMAL HIGH (ref 70–99)
Glucose-Capillary: 133 mg/dL — ABNORMAL HIGH (ref 70–99)

## 2023-08-02 MED ORDER — DOCUSATE SODIUM 100 MG PO CAPS
100.0000 mg | ORAL_CAPSULE | Freq: Every day | ORAL | Status: DC
  Administered 2023-08-02: 100 mg via ORAL
  Filled 2023-08-02: qty 1

## 2023-08-02 MED ORDER — OXYCODONE HCL 10 MG PO TABS: 10.0000 mg | ORAL_TABLET | ORAL | 0 refills | Status: DC | PRN

## 2023-08-02 MED ORDER — METHOCARBAMOL 500 MG PO TABS: 500.0000 mg | ORAL_TABLET | Freq: Four times a day (QID) | ORAL | 2 refills | Status: DC | PRN

## 2023-08-02 MED ORDER — OXYCODONE HCL 10 MG PO TABS
10.0000 mg | ORAL_TABLET | ORAL | 0 refills | Status: DC | PRN
Start: 2023-08-02 — End: 2024-09-12
  Filled 2023-08-02: qty 42, 7d supply, fill #0

## 2023-08-02 NOTE — Discharge Summary (Signed)
Patient ID: Brian Vazquez MRN: [id number] DOB/AGE: 23-Feb-1951 72 y.o.  Admit date: 08/01/2023 Discharge date: 08/02/2023  Admission Diagnoses: L4-5 isthmic lumbar spondylolisthesis and L3-5 stenosis   Discharge Diagnoses: L4-5 isthmic lumbar spondylolisthesis and L3-5 stenosis    Discharged Condition: Stable  Hospital Course:  Brian Vazquez is a 72 y.o. male who was admitted following an uncomplicated L4-5 TLIF, L3-4 laminectomy. They were recovered in PACU and transferred to [facility]. Hospital course was uncomplicated. Pt stable for discharge today. Pt to f/u in office for routine post op visit. Pt is in agreement w/ plan.    Discharge Exam: Blood pressure (!) 124/96, pulse 84, temperature 97.7 F (36.5 C), temperature source Oral, resp. rate 17, height 6\' 6"  (1.981 m), weight 121.1 kg, SpO2 97%. A&O x3 Speech fluent, appropriate Strength 5/5 x4.  SILTx4.  Dressing c/d/I.   Disposition: Discharge disposition: 01-Home or Self Care       Discharge Instructions     Incentive spirometry RT   Complete by: As directed       Allergies as of 08/02/2023   No Known Allergies      Medication List     STOP taking these medications    meloxicam 15 MG tablet Commonly known as: MOBIC       TAKE these medications    acetaminophen 500 MG tablet Commonly known as: TYLENOL Take 1,000 mg by mouth 3 (three) times daily.   Aspirin 81 MG Caps Take 81 mg by mouth daily. Start taking on: August 08, 2023 What changed: These instructions start on August 08, 2023. If you are unsure what to do until then, ask your doctor or other care provider.   atorvastatin 80 MG tablet Commonly known as: LIPITOR Take 80 mg by mouth at bedtime.   docusate sodium 50 MG capsule Commonly known as: COLACE Take 50 mg by mouth 2 (two) times daily.   DULoxetine 60 MG capsule Commonly known as: CYMBALTA Take 60 mg by mouth daily.   ezetimibe 10 MG tablet Commonly known as: ZETIA Take  10 mg by mouth daily.   ferrous sulfate 325 (65 FE) MG tablet Take 325 mg by mouth daily with breakfast.   Fish Oil 1000 MG Caps Take 2,000 mg by mouth 3 (three) times daily.   hydrogen peroxide 3 % external solution See admin instructions. Rinse orally when needed for gum sores   loratadine 10 MG tablet Commonly known as: CLARITIN Take 10 mg by mouth daily.   losartan 25 MG tablet Commonly known as: COZAAR Take 12.5 mg by mouth daily at 12 noon.   metFORMIN 500 MG tablet Commonly known as: GLUCOPHAGE Take 500-1,000 mg by mouth See admin instructions. Take 1000 mg in the morning, take 500 mg at noon, and 1000 mg in the evening   methocarbamol 500 MG tablet Commonly known as: ROBAXIN Take 1 tablet (500 mg total) by mouth every 6 (six) hours as needed for muscle spasms.   NEURIVA PLUS PO Take 1 tablet by mouth daily.   Oxycodone HCl 10 MG Tabs Take 1 tablet (10 mg total) by mouth every 4 (four) hours as needed for severe pain ((score 7 to 10)).   pantoprazole 40 MG tablet Commonly known as: PROTONIX Take 40 mg by mouth daily.   Polyethylene Glycol 3350 Powd Take 17 g by mouth daily as needed (constipation).   tamsulosin 0.4 MG Caps capsule Commonly known as: FLOMAX Take 0.4 mg by mouth at bedtime.   Vitamin D3 50  MCG Caps Take 2,000 Units by mouth daily.         Signed: Clovis Vazquez 08/02/2023, 9:42 AM

## 2023-08-02 NOTE — Evaluation (Signed)
Occupational Therapy Evaluation Patient Details Name: Brian Vazquez MRN: [id number] DOB: July 08, 1951 Today's Date: 08/02/2023   History of Present Illness 72 yo male s/p 8/6 TLIF L4-5 L3-4 PMH arthritis, chronic pain, DM HTN HLD, neuropathy, major depression with psychotic feature, sleep apnea on cpap meniscus repair   Clinical Impression   Patient is s/p TLIF L4-5 L3-4 surgery resulting in functional limitations due to the deficits listed below (see OT problem list). Pt at baseline indep alone and driving.  Patient will benefit from skilled OT acutely to increase independence and safety with ADLS to allow discharge home.  Recommendation for RW for DME at d/c for safety        If plan is discharge home, recommend the following:      Functional Status Assessment  Patient has had a recent decline in their functional status and demonstrates the ability to make significant improvements in function in a reasonable and predictable amount of time.  Equipment Recommendations  Other (comment) (RW)    Recommendations for Other Services       Precautions / Restrictions Precautions Precautions: Back Precaution Comments: educated on back precautions with adls and iadls Required Braces or Orthoses: Spinal Brace Spinal Brace: Thoracolumbosacral orthotic;Applied in sitting position      Mobility Bed Mobility Overal bed mobility: Modified Independent             General bed mobility comments: reinforced rolling    Transfers Overall transfer level: Needs assistance Equipment used: Rollator (4 wheels) Transfers: Sit to/from Stand Sit to Stand: From elevated surface, Supervision           General transfer comment: cues for hand placement, scooting to the edge and elevated surface. advised pt could require pillows or beach towels to power up from surfaces at home      Balance Overall balance assessment: Mild deficits observed, not formally tested                                          ADL either performed or assessed with clinical judgement   ADL Overall ADL's : Modified independent                                       General ADL Comments: educated on figure 4 positioning and reacher use for dressing. pt able to dress for home. pt educated on hygiene and positioning. pt educated on back brace and adjusted to XL fit due to the brace preset to Large and popped loose with pt first attempt at application     Vision Baseline Vision/History: 1 Wears glasses Ability to See in Adequate Light: 0 Adequate Patient Visual Report: No change from baseline Vision Assessment?: No apparent visual deficits     Perception         Praxis         Pertinent Vitals/Pain Pain Assessment Pain Assessment: No/denies pain     Extremity/Trunk Assessment Upper Extremity Assessment Upper Extremity Assessment: Overall WFL for tasks assessed   Lower Extremity Assessment Lower Extremity Assessment: Defer to PT evaluation   Cervical / Trunk Assessment Cervical / Trunk Assessment: Back Surgery   Communication Communication Communication: No apparent difficulties   Cognition Arousal: Alert Behavior During Therapy: WFL for tasks assessed/performed Overall Cognitive Status: Within Functional Limits for tasks assessed  General Comments  incision dry and intact. Daughter and pt advised to see dc summary for date to shower x3 during session and the wound can be open to air. Sarah NP in room and expressed no dressing need    Exercises     Shoulder Instructions      Home Living Family/patient expects to be discharged to:: Private residence Living Arrangements: Alone Available Help at Discharge: Family;Available PRN/intermittently Type of Home: House Home Access: Stairs to enter [employer] of Steps: 2+1 (2 onto a porch no rails and 1 into the house) Entrance  Stairs-Rails: None Home Layout: One level     Bathroom Shower/Tub: Chief Strategy Officer: Standard     Home Equipment: Cane - single point (cane has been mis placed in the pacu. Informaiton is the daughters home with hard wood floors)   Additional Comments: 3 dogs in the home and small children. daughter to purchase reacher      Prior Functioning/Environment Prior Level of Function : Independent/Modified Independent;Driving                        OT Problem List: Obesity;Decreased safety awareness;Decreased knowledge of use of DME or AE;Decreased knowledge of precautions;Impaired balance (sitting and/or standing)      OT Treatment/Interventions:      OT Goals(Current goals can be found in the care plan section) Acute Rehab OT Goals Patient Stated Goal: to go home today  OT Frequency:      Co-evaluation              AM-PAC OT "6 Clicks" Daily Activity     Outcome Measure Help from another person eating meals?: None Help from another person taking care of personal grooming?: None Help from another person toileting, which includes using toliet, bedpan, or urinal?: None Help from another person bathing (including washing, rinsing, drying)?: None Help from another person to put on and taking off regular upper body clothing?: None Help from another person to put on and taking off regular lower body clothing?: None 6 Click Score: 24   End of Session Equipment Utilized During Treatment: Rolling walker (2 wheels);Back brace Nurse Communication: Mobility status;Precautions  Activity Tolerance: Patient tolerated treatment well Patient left: in bed;with call bell/phone within reach  OT Visit Diagnosis: Unsteadiness on feet (R26.81)                Time: 0737-1062 OT Time Calculation (min): 40 min Charges:  OT General Charges $OT Visit: 1 Visit OT Evaluation $OT Eval Moderate Complexity: 1 Mod OT Treatments $Self Care/Home Management : 8-22  mins   Brynn, OTR/L  Acute Rehabilitation Services Office: [phone] .   Mateo Flow 08/02/2023, 9:43 AM

## 2023-08-02 NOTE — Care Management (Signed)
Patient with order to DC to home today. Unit staff to provide DME needed for home.   No HH needs identified Patient will have family/ friends provide transportation home. No other TOC needs identified for DC 

## 2023-08-02 NOTE — Progress Notes (Signed)
Patient ID: Brian Vazquez, male   DOB: 1951/01/01, 72 y.o.   MRN: [id number]   An After Visit Summary was printed and given to the patient.  Patient education given on medication changes and follow up appointments and the patient expresses understanding and acceptance of instructions.    Lidia Collum 08/02/2023 12:08 PM

## 2023-08-02 NOTE — Progress Notes (Signed)
Visited early morning. Pt still sleeping. Will follow as needed.

## 2023-08-02 NOTE — Evaluation (Signed)
Physical Therapy Evaluation  Patient Details Name: Brian Vazquez MRN: [id number] DOB: 04-20-1951 Today's Date: 08/02/2023  History of Present Illness  Pt is a 72 y/o male who presents s/p  L3-L5 TLIF on 08/01/2023. PMH significant for arthritis, chronic pain, DM, HTN, neuropathy, major depression with psychotic feature, sleep apnea on cpap, meniscus repair  Clinical Impression  Pt admitted with above diagnosis. At the time of PT eval, pt was able to demonstrate transfers and ambulation with gross CGA to supervision for safety and RW for support. Pt was educated on precautions, brace application/wearing schedule, appropriate activity progression, and car transfer. Pt currently with functional limitations due to the deficits listed below (see PT Problem List). Pt will benefit from skilled PT to increase their independence and safety with mobility to allow discharge to the venue listed below.          If plan is discharge home, recommend the following: A little help with walking and/or transfers;A little help with bathing/dressing/bathroom;Assistance with cooking/housework;Assist for transportation;Help with stairs or ramp for entrance   Can travel by private vehicle        Equipment Recommendations Rolling walker (2 wheels)  Recommendations for Other Services       Functional Status Assessment Patient has had a recent decline in their functional status and demonstrates the ability to make significant improvements in function in a reasonable and predictable amount of time.     Precautions / Restrictions Precautions Precautions: Back Precaution Booklet Issued: Yes (comment) Precaution Comments: Reviewed handout and pt was cued for pecautions during functional mobility. Required Braces or Orthoses: Spinal Brace Spinal Brace: Applied in sitting position;Lumbar corset Restrictions Weight Bearing Restrictions: No      Mobility  Bed Mobility Overal bed mobility: Modified  Independent             General bed mobility comments: HOB flat and rails lowered to simulate home environment. No assist required but pt required step-by-step cues for optimal log roll technique.    Transfers Overall transfer level: Needs assistance Equipment used: Rolling walker (2 wheels) Transfers: Sit to/from Stand Sit to Stand: From elevated surface, Supervision, Contact guard assist           General transfer comment: VC's for hand placement on seated surface for safety. No assist required to power up however closer guard/supervision provided for safety.    Ambulation/Gait Ambulation/Gait assistance: Contact guard assist, Supervision Gait Distance (Feet): 300 Feet Assistive device: Rolling walker (2 wheels) Gait Pattern/deviations: Step-through pattern, Decreased stride length, Trunk flexed Gait velocity: Decreased Gait velocity interpretation: <1.8 ft/sec, indicate of risk for recurrent falls   General Gait Details: VC's for improved posture, closer walker proximity and forward gaze.  Stairs Stairs: Yes Stairs assistance: Contact guard assist Stair Management: One rail Left, Step to pattern, Forwards Number of Stairs: 3 (3+1+1) General stair comments: Initially with heavy pull on railing. Pt will not have rails initially upon return home with daughter, so practiced with increasingly less UE support and HHA support from therapist on the R. Pt will have a pillar to touch to on the L when negtiating stairs into the house.  Wheelchair Mobility     Tilt Bed    Modified Rankin (Stroke Patients Only)       Balance Overall balance assessment: Mild deficits observed, not formally tested  Pertinent Vitals/Pain Pain Assessment Pain Assessment: No/denies pain    Home Living Family/patient expects to be discharged to:: Private residence Living Arrangements: Alone Available Help at Discharge:  Family;Available PRN/intermittently Type of Home: House Home Access: Stairs to enter Entrance Stairs-Rails: None Entrance Stairs-Number of Steps: 2+1 (2 onto a porch no rails and 1 into the house)   Home Layout: One level Home Equipment: Cane - single point (cane has been mis placed in the pacu. Informaiton is the daughters home with hard wood floors) Additional Comments: Pt going home with daugher initially. 3 dogs in the home and small children. daughter to purchase reacher    Prior Function Prior Level of Function : Independent/Modified Independent;Driving                     Extremity/Trunk Assessment   Upper Extremity Assessment Upper Extremity Assessment: Defer to OT evaluation    Lower Extremity Assessment Lower Extremity Assessment: Generalized weakness (Mild; consistent with pre-op diagnosis)    Cervical / Trunk Assessment Cervical / Trunk Assessment: Back Surgery  Communication   Communication Communication: No apparent difficulties  Cognition Arousal: Alert Behavior During Therapy: WFL for tasks assessed/performed Overall Cognitive Status: Within Functional Limits for tasks assessed                                          General Comments General comments (skin integrity, edema, etc.): incision dry and intact. Daughter and pt advised to see dc summary for date to shower x3 during session and the wound can be open to air. Sarah NP in room and expressed no dressing need    Exercises     Assessment/Plan    PT Assessment Patient needs continued PT services  PT Problem List Decreased strength;Decreased activity tolerance;Decreased mobility;Decreased balance;Decreased knowledge of use of DME;Decreased safety awareness;Decreased knowledge of precautions;Pain       PT Treatment Interventions DME instruction;Gait training;Stair training;Functional mobility training;Therapeutic activities;Therapeutic exercise;Balance training;Patient/family  education    PT Goals (Current goals can be found in the Care Plan section)  Acute Rehab PT Goals Patient Stated Goal: Home today PT Goal Formulation: With patient Time For Goal Achievement: 08/09/23 Potential to Achieve Goals: Good    Frequency Min 1X/week     Co-evaluation               AM-PAC PT "6 Clicks" Mobility  Outcome Measure Help needed turning from your back to your side while in a flat bed without using bedrails?: None Help needed moving from lying on your back to sitting on the side of a flat bed without using bedrails?: None Help needed moving to and from a bed to a chair (including a wheelchair)?: A Little Help needed standing up from a chair using your arms (e.g., wheelchair or bedside chair)?: A Little Help needed to walk in hospital room?: A Little Help needed climbing 3-5 steps with a railing? : A Little 6 Click Score: 20    End of Session Equipment Utilized During Treatment: Gait belt Activity Tolerance: Patient tolerated treatment well Patient left: in bed;with call bell/phone within reach Nurse Communication: Mobility status PT Visit Diagnosis: Unsteadiness on feet (R26.81);Pain Pain - part of body:  (back)    Time: 1610-9604 PT Time Calculation (min) (ACUTE ONLY): 31 min   Charges:   PT Evaluation $PT Eval Low Complexity: 1 Low PT Treatments $Gait Training: 8-22  mins PT General Charges $$ ACUTE PT VISIT: 1 Visit         Conni Slipper, PT, DPT Acute Rehabilitation Services Secure Chat Preferred Office: [phone]   Marylynn Pearson 08/02/2023, 12:43 PM

## 2023-08-03 ENCOUNTER — Encounter (HOSPITAL_COMMUNITY): Payer: Self-pay | Admitting: Neurosurgery

## 2024-05-22 ENCOUNTER — Telehealth: Payer: Self-pay | Admitting: Neurology

## 2024-05-22 NOTE — Telephone Encounter (Signed)
 Patient called in today to check on referral sent from [place]. I let him know we did receive it and it is under review by dr. Tresia Fruit and would give him a call back to discuss as soon as we hear back. Patient stated that he would like to talk to Dr. Tresia Fruit on the phone it would maybe take 5 minutes if she can. I asked if he could let me know what it was about and I can definitely send her a message and see if she could do that or if it's something we can handle. He didn't want to discuss at first but then stated he said at the appointment with her last year, she told him his back had been broken and was like that for a long time and she didn't know how he was even walking. And he wants this in a letter? Or some kind of documentation that she said this..? I review the MRI Dr. Tresia Fruit ordered and don't see anything about something being broken but multi level arthritis and nerve pinching. I let him know once I got his referral back I would give him a call back, the only reason I have it under review is because based on last notes I wasn't really sure if Dr. Tresia Fruit has anything further to offer towards this-notes [facility]

## 2024-05-22 NOTE — Telephone Encounter (Signed)
 I spoke to nice patient. He wanted documentation of his lumbar spine MRI and I informed him the report and any records/notes are accessible to him through [facility] as well as through medical record request. He asked about pain management; As far as his low back pain and also his polyneuropathy pain in his feet, he can be treated by pain management at the [place] or by his primary care at the [facility] nothing further from neurology since he sees neurosurgery and he has care at the [place] in [place]. We do not treat chronic pain.  Patient agreed and said he very much appreciated the call. thanks

## 2024-05-27 ENCOUNTER — Other Ambulatory Visit: Payer: Self-pay | Admitting: Neurosurgery

## 2024-05-27 DIAGNOSIS — M4316 Spondylolisthesis, lumbar region: Secondary | ICD-10-CM

## 2024-05-28 ENCOUNTER — Encounter: Payer: Self-pay | Admitting: Neurosurgery

## 2024-06-05 ENCOUNTER — Ambulatory Visit
Admission: RE | Admit: 2024-06-05 | Discharge: 2024-06-05 | Disposition: A | Discharge status: Home / Self Care | Source: Ambulatory Visit | Attending: Neurosurgery | Admitting: Neurosurgery

## 2024-06-05 DIAGNOSIS — M4316 Spondylolisthesis, lumbar region: Secondary | ICD-10-CM

## 2024-07-11 ENCOUNTER — Encounter: Payer: Self-pay | Admitting: Neurosurgery

## 2024-07-11 ENCOUNTER — Other Ambulatory Visit: Payer: Self-pay | Admitting: Neurosurgery

## 2024-07-11 DIAGNOSIS — M48061 Spinal stenosis, lumbar region without neurogenic claudication: Secondary | ICD-10-CM

## 2024-07-30 ENCOUNTER — Other Ambulatory Visit: Payer: Self-pay | Admitting: Surgery

## 2024-08-15 ENCOUNTER — Ambulatory Visit
Admission: RE | Admit: 2024-08-15 | Discharge: 2024-08-15 | Disposition: A | Discharge status: Home / Self Care | Source: Ambulatory Visit | Attending: Neurosurgery

## 2024-08-15 DIAGNOSIS — M48061 Spinal stenosis, lumbar region without neurogenic claudication: Secondary | ICD-10-CM

## 2024-08-15 MED ORDER — GADOPICLENOL 0.5 MMOL/ML IV SOLN
10.0000 mL | Freq: Once | INTRAVENOUS | Status: AC | PRN
  Administered 2024-08-15: 10 mL via INTRAVENOUS

## 2024-08-22 ENCOUNTER — Other Ambulatory Visit: Payer: Self-pay | Admitting: Neurosurgery

## 2024-09-04 NOTE — Pre-Procedure Instructions (Signed)
 Surgical Instructions   Your procedure is scheduled on September 10, 2024. Report to [facility] Main Entrance A at 8:30 A.M., then check in with the Admitting office. Any questions or running late day of surgery: call [phone]  Questions prior to your surgery date: call [phone], Monday-Friday, 8am-4pm. If you experience any cold or flu symptoms such as cough, fever, chills, shortness of breath, etc. between now and your scheduled surgery, please notify us  at the above number.     Remember:  Do not eat or drink after midnight the night before your surgery    Take these medicines the morning of surgery with A SIP OF WATER: acetaminophen  (TYLENOL )  ARIPiprazole (ABILIFY)  DULoxetine  (CYMBALTA )  ezetimibe  (ZETIA )  lacosamide (VIMPAT)  loratadine (CLARITIN)  pantoprazole  (PROTONIX )    May take these medicines IF NEEDED: fluticasone (FLONASE) nasal spray    Follow your surgeon's instructions on when to stop Aspirin.  If no instructions were given by your surgeon then you will need to call the office to get those instructions.     One week prior to surgery, STOP taking any Aleve, Naproxen, Ibuprofen, Motrin, Advil, Goody's, BC's, all herbal medications, fish oil, and non-prescription vitamins. This includes your medication: celecoxib (CELEBREX)    WHAT DO I DO ABOUT MY DIABETES MEDICATION?   Do not take metFORMIN  (GLUCOPHAGE ) the morning of surgery.   HOW TO MANAGE YOUR DIABETES BEFORE AND AFTER SURGERY  Why is it important to control my blood sugar before and after surgery? Improving blood sugar levels before and after surgery helps healing and can limit problems. A way of improving blood sugar control is eating a healthy diet by:  Eating less sugar and carbohydrates  Increasing activity/exercise  Talking with your doctor about reaching your blood sugar goals High blood sugars (greater than 180 mg/dL) can raise your risk of infections and slow your recovery, so  you will need to focus on controlling your diabetes during the weeks before surgery. Make sure that the doctor who takes care of your diabetes knows about your planned surgery including the date and location.  How do I manage my blood sugar before surgery? Check your blood sugar at least 4 times a day, starting 2 days before surgery, to make sure that the level is not too high or low.  Check your blood sugar the morning of your surgery when you wake up and every 2 hours until you get to the Short Stay unit.  If your blood sugar is less than 70 mg/dL, you will need to treat for low blood sugar: Do not take insulin . Treat a low blood sugar (less than 70 mg/dL) with  cup of clear juice (cranberry or apple), 4 glucose tablets, OR glucose gel. Recheck blood sugar in 15 minutes after treatment (to make sure it is greater than 70 mg/dL). If your blood sugar is not greater than 70 mg/dL on recheck, call [phone] for further instructions. Report your blood sugar to the short stay nurse when you get to Short Stay.  If you are admitted to the hospital after surgery: Your blood sugar will be checked by the staff and you will probably be given insulin  after surgery (instead of oral diabetes medicines) to make sure you have good blood sugar levels. The goal for blood sugar control after surgery is 80-180 mg/dL.                      Do NOT Smoke (Tobacco/Vaping) for 24 hours  prior to your procedure.  If you use a CPAP at night, you may bring your mask/headgear for your overnight stay.   You will be asked to remove any contacts, glasses, piercing's, hearing aid's, dentures/partials prior to surgery. Please bring cases for these items if needed.    Patients discharged the day of surgery will not be allowed to drive home, and someone needs to stay with them for 24 hours.  SURGICAL WAITING ROOM VISITATION Patients may have no more than 2 support people in the waiting area - these visitors may rotate.    Pre-op nurse will coordinate an appropriate time for 1 ADULT support person, who may not rotate, to accompany patient in pre-op.  Children under the age of 23 must have an adult with them who is not the patient and must remain in the main waiting area with an adult.  If the patient needs to stay at the hospital during part of their recovery, the visitor guidelines for inpatient rooms apply.  Please refer to the [facility] website for the visitor guidelines for any additional information.   If you received a COVID test during your pre-op visit  it is requested that you wear a mask when out in public, stay away from anyone that may not be feeling well and notify your surgeon if you develop symptoms. If you have been in contact with anyone that has tested positive in the last 10 days please notify you surgeon.      Pre-operative 5 CHG Bathing Instructions   You can play a key role in reducing the risk of infection after surgery. Your skin needs to be as free of germs as possible. You can reduce the number of germs on your skin by washing with CHG (chlorhexidine  gluconate) soap before surgery. CHG is an antiseptic soap that kills germs and continues to kill germs even after washing.   DO NOT use if you have an allergy to chlorhexidine /CHG or antibacterial soaps. If your skin becomes reddened or irritated, stop using the CHG and notify one of our RNs at [phone].   Please shower with the CHG soap starting 4 days before surgery using the following schedule:     Please keep in mind the following:  DO NOT shave, including legs and underarms, starting the day of your first shower.   You may shave your face at any point before/day of surgery.  Place clean sheets on your bed the day you start using CHG soap. Use a clean washcloth (not used since being washed) for each shower. DO NOT sleep with pets once you start using the CHG.   CHG Shower Instructions:  Wash your face and private area  with normal soap. If you choose to wash your hair, wash first with your normal shampoo.  After you use shampoo/soap, rinse your hair and body thoroughly to remove shampoo/soap residue.  Turn the water OFF and apply about 3 tablespoons (45 ml) of CHG soap to a CLEAN washcloth.  Apply CHG soap ONLY FROM YOUR NECK DOWN TO YOUR TOES (washing for 3-5 minutes)  DO NOT use CHG soap on face, private areas, open wounds, or sores.  Pay special attention to the area where your surgery is being performed.  If you are having back surgery, having someone wash your back for you may be helpful. Wait 2 minutes after CHG soap is applied, then you may rinse off the CHG soap.  Pat dry with a clean towel  Put on clean clothes/pajamas  If you choose to wear lotion, please use ONLY the CHG-compatible lotions that are listed below.  Additional instructions for the day of surgery: DO NOT APPLY any lotions, deodorants, cologne, or perfumes.   Do not bring valuables to the hospital. [facility] is not responsible for any belongings/valuables. Do not wear nail polish, gel polish, artificial nails, or any other type of covering on natural nails (fingers and toes) Do not wear jewelry or makeup Put on clean/comfortable clothes.  Please brush your teeth.  Ask your nurse before applying any prescription medications to the skin.     CHG Compatible Lotions   Aveeno Moisturizing lotion  Cetaphil Moisturizing Cream  Cetaphil Moisturizing Lotion  Clairol Herbal Essence Moisturizing Lotion, Dry Skin  Clairol Herbal Essence Moisturizing Lotion, Extra Dry Skin  Clairol Herbal Essence Moisturizing Lotion, Normal Skin  Curel Age Defying Therapeutic Moisturizing Lotion with Alpha Hydroxy  Curel Extreme Care Body Lotion  Curel Soothing Hands Moisturizing Hand Lotion  Curel Therapeutic Moisturizing Cream, Fragrance-Free  Curel Therapeutic Moisturizing Lotion, Fragrance-Free  Curel Therapeutic Moisturizing Lotion, Original  Formula  Eucerin Daily Replenishing Lotion  Eucerin Dry Skin Therapy Plus Alpha Hydroxy Crme  Eucerin Dry Skin Therapy Plus Alpha Hydroxy Lotion  Eucerin Original Crme  Eucerin Original Lotion  Eucerin Plus Crme Eucerin Plus Lotion  Eucerin TriLipid Replenishing Lotion  Keri Anti-Bacterial Hand Lotion  Keri Deep Conditioning Original Lotion Dry Skin Formula Softly Scented  Keri Deep Conditioning Original Lotion, Fragrance Free Sensitive Skin Formula  Keri Lotion Fast Absorbing Fragrance Free Sensitive Skin Formula  Keri Lotion Fast Absorbing Softly Scented Dry Skin Formula  Keri Original Lotion  Keri Skin Renewal Lotion Keri Silky Smooth Lotion  Keri Silky Smooth Sensitive Skin Lotion  Nivea Body Creamy Conditioning Oil  Nivea Body Extra Enriched Lotion  Nivea Body Original Lotion  Nivea Body Sheer Moisturizing Lotion Nivea Crme  Nivea Skin Firming Lotion  NutraDerm 30 Skin Lotion  NutraDerm Skin Lotion  NutraDerm Therapeutic Skin Cream  NutraDerm Therapeutic Skin Lotion  ProShield Protective Hand Cream  Provon moisturizing lotion  Please read over the following fact sheets that you were given.

## 2024-09-05 ENCOUNTER — Encounter (HOSPITAL_COMMUNITY)
Admission: RE | Admit: 2024-09-05 | Discharge: 2024-09-05 | Disposition: A | Discharge status: Home / Self Care | Source: Ambulatory Visit | Attending: Neurosurgery | Admitting: Neurosurgery

## 2024-09-05 ENCOUNTER — Encounter (HOSPITAL_COMMUNITY): Payer: Self-pay

## 2024-09-05 ENCOUNTER — Other Ambulatory Visit: Payer: Self-pay

## 2024-09-05 VITALS — BP 124/69 | HR 86 | Temp 98.0°F | Resp 19 | Ht 78.0 in | Wt 287.0 lb

## 2024-09-05 DIAGNOSIS — Z01818 Encounter for other preprocedural examination: Secondary | ICD-10-CM | POA: Diagnosis present

## 2024-09-05 LAB — SURGICAL PCR SCREEN
MRSA, PCR: NEGATIVE
Staphylococcus aureus: NEGATIVE

## 2024-09-05 LAB — CBC
HCT: 31.4 % — ABNORMAL LOW (ref 39.0–52.0)
Hemoglobin: 9.8 g/dL — ABNORMAL LOW (ref 13.0–17.0)
MCH: 25.1 pg — ABNORMAL LOW (ref 26.0–34.0)
MCHC: 31.2 g/dL (ref 30.0–36.0)
MCV: 80.3 fL (ref 80.0–100.0)
Platelets: 220 K/uL (ref 150–400)
RBC: 3.91 MIL/uL — ABNORMAL LOW (ref 4.22–5.81)
RDW: 15.9 % — ABNORMAL HIGH (ref 11.5–15.5)
WBC: 2.9 K/uL — ABNORMAL LOW (ref 4.0–10.5)
nRBC: 0 % (ref 0.0–0.2)

## 2024-09-05 LAB — BASIC METABOLIC PANEL WITH GFR
Anion gap: 8 (ref 5–15)
BUN: 11 mg/dL (ref 8–23)
CO2: 25 mmol/L (ref 22–32)
Calcium: 9.4 mg/dL (ref 8.9–10.3)
Chloride: 106 mmol/L (ref 98–111)
Creatinine, Ser: 0.82 mg/dL (ref 0.61–1.24)
GFR, Estimated: >60 mL/min (ref >60)
Glucose, Bld: 153 mg/dL — ABNORMAL HIGH (ref 70–99)
Potassium: 3.9 mmol/L (ref 3.5–5.1)
Sodium: 139 mmol/L (ref 135–145)

## 2024-09-05 LAB — GLUCOSE, CAPILLARY: Glucose-Capillary: 129 mg/dL — ABNORMAL HIGH (ref 70–99)

## 2024-09-05 NOTE — Progress Notes (Signed)
 Left message for [facility] at Dr. Debby' office to review hemoglobin.

## 2024-09-05 NOTE — Progress Notes (Signed)
 [facility] [place] Cardiologist - denies  PPM/ICD - denies   Chest x-ray - denies EKG - 09/05/24 Stress Test - denies ECHO - denies Cardiac Cath - denies  Sleep Study - OSA+ and wears CPAP  Fasting Blood Sugar - 80's Checks Blood Sugar once a week  Last dose of GLP1 agonist-  n/a GLP1 instructions:  n/a  Blood Thinner Instructions: n/a Aspirin Instructions: last dose of Aspirin was 9/8  ERAS Protcol - NPO PRE-SURGERY Ensure or G2- n/a  COVID TEST- n/a   Anesthesia review: yes - record request  Patient denies shortness of breath, fever, cough and chest pain at PAT appointment   All instructions explained to the patient, with a verbal understanding of the material. Patient agrees to go over the instructions while at home for a better understanding. Patient also instructed to self quarantine after being tested for COVID-19. The opportunity to ask questions was provided.

## 2024-09-06 NOTE — Progress Notes (Signed)
 09/06/2024  Brian Vazquez  [id number]  Lonni Schooling, MD (Inactive)   Chief complaint:  Patient is here for the following No chief complaint on file. SABRA  HPI: History of Present Illness 73 year old male presents for evaluation of peripheral neuropathy.  Peripheral Neuropathy Diagnosed with diabetes, currently on lacosamide . Reports improvement in symptoms by the third night of treatment. Still taking it, on the second bottle. Has not started 150 mg dosage yet. - Onset: Improvement by the third night of treatment. - Medication: Currently on lacosamide , still taking it, on the second bottle. - Severity: Improvement in symptoms.  Scheduled to have screws replaced by Dr. Vicci Ned.  MEDICATIONS CURRENT MEDS: Lacosamide  100 mg  PREVIOUS MEDS: Lacosamide  100 mg, 150 mg   Problem List[1]  Current Medications[2]  Tobacco Use: Medium Risk (09/05/2024)   Received from [facility]   Patient History   . Smoking Tobacco Use: Former   . Smokeless Tobacco Use: Never   . Passive Exposure: Not on file    REVIEW OF SYSTEMS:   Review of Systems  There were no vitals filed for this visit. There is no height or weight on file to calculate BMI.      PHYSICAL EXAM:   GEN: Well appearing, no apparent distress  NECK: supple  SKIN: Normal.  NEURO:  Alert and Oriented times three, Normal speech and language. Patient gives a coherent history and has a normal train of thought. Recent and remote memory appear to be normal.  Normal attention and concentration. CRANIAL NERVES:  Extraocular motions are intact no diplopia.  Normal facial symmetry and sensation.  Uvula and palate upward and midline.  Sensory normal to light touch throughout  Motor moves all extremities well, no abnormal movements. No evidence of tremor.  GAIT: Normal walking. Gait is normal not wide-based.   Assessment & Plan 1. Peripheral neuropathy: Chronic. Consistent with diabetic neuropathy. Left leg more affected  than right. - Continue lacosamide  regimen, gradually increase to 150 mg. - If well-tolerated, may increase to 200 mg. - Consider capsaicin patch for nerve pain in feet. - Needle exam performed today.  Follow-up - Discussed follow-up regarding lacosamide  dosage and effectiveness.  PROCEDURE Needle exam - Discussed procedure, including use of thin needle to listen to muscle sounds indicating nerve injury. Patient expressed concern but agreed to one needle insertion. - Thin needle inserted into calf muscles to listen to muscle sounds. - Procedure completed without complications. Patient tolerated well.   Diagnoses and all orders for this visit:  Type 2 diabetes mellitus with diabetic polyneuropathy, without long-term current use of insulin     (CMD) -     NCV/EMG/Ultrasound      Call or go to ER if any acute changes.  Safety and side effects discussed. All question answered.    I agree the documentation is accurate and complete.  Electronically signed by: Camellia ONEIDA Custard, MD 09/06/2024 2:38 PM      [1] Patient Active Problem List Diagnosis  . Hypertension  . Carcinoid tumor of small intestine  . Diabetes (HCC)  . Arthritis  . Bleeding from wound  . Neuroendocrine carcinoma of small bowel (HCC)  . Congenital spinal stenosis  . Diabetic polyneuropathy associated with type 2 diabetes mellitus    (CMD)  . History of breast cancer  [2]  Current Outpatient Medications:  .  acetaminophen  (TYLENOL ) 500 mg tablet, Take 1,000 mg by mouth 2 (two) times a day as needed., Disp: , Rfl:  .  ARIPiprazole  (ABILIFY ) 2  mg tablet, Take 2 mg by mouth., Disp: , Rfl:  .  aspirin (Bayer Low Dose Aspirin) 81 mg EC tablet, Take 81 mg by mouth every morning., Disp: , Rfl:  .  atorvastatin  (LIPITOR) 80 mg tablet, Take 80 mg by mouth Once Daily., Disp: , Rfl:  .  butalbit/acetamin/caff/codeine (butalbitaL-acetaminop-caf-cod) 50-325-40-30 mg cap, Take 3 capsules by mouth daily., Disp: , Rfl:  .   celecoxib (CeleBREX) 200 mg capsule, Take 200 mg by mouth., Disp: , Rfl:  .  cholecalciferol (VITAMIN D3) 2,000 unit tablet, Take 50 mcg by mouth daily., Disp: , Rfl:  .  docusate sodium  (COLACE) 50 mg capsule, Take 50 mg by mouth 2 (two) times a day as needed., Disp: , Rfl:  .  doxepin (SILENOR) 3 mg tab tablet, Take 3 mg by mouth., Disp: , Rfl:  .  DULoxetine  (CYMBALTA ) 60 mg capsule, Take 60 mg by mouth daily., Disp: , Rfl:  .  ezetimibe  (ZETIA ) 10 mg tablet, Take 10 mg by mouth daily., Disp: , Rfl:  .  ferrous sulfate  325 mg (65 mg iron) tablet, Take 325 mg by mouth daily before breakfast., Disp: , Rfl:  .  fluticasone propionate (FLONASE) 50 mcg/spray nasal spray, Administer into affected nostril(s)., Disp: , Rfl:  .  gabapentin (NEURONTIN) 100 mg capsule, Take 200 mg by mouth 3 (three) times a day., Disp: , Rfl:  .  lacosamide  (VIMPAT ) 100 mg tablet, Take 1 tablet (100 mg total) by mouth 2 (two) times a day for 7 days., Disp: 14 tablet, Rfl: 0 .  [START ON 09/10/2024] lacosamide  (VIMPAT ) 150 mg tab tablet, Take 1 tablet (150 mg total) by mouth 2 (two) times a day., Disp: 60 tablet, Rfl: 5 .  lacosamide  (VIMPAT ) 50 mg tablet, Take 1 tablet (50 mg total) by mouth 2 (two) times a day for 7 days., Disp: 14 tablet, Rfl: 0 .  lisinopriL-hydrochlorothiazide (PRINZIDE) 20-25 mg per tablet, Take 0.5 tablets by mouth daily., Disp: , Rfl:  .  loratadine (CLARITIN) 10 mg tablet, Take 10 mg by mouth daily as needed for allergies., Disp: , Rfl:  .  losartan  (COZAAR ) 25 mg tablet, Take 12.5 mg by mouth., Disp: , Rfl:  .  meloxicam (MOBIC) 7.5 mg tablet, Take 1 tablet by mouth daily., Disp: , Rfl:  .  metFORMIN  (GLUCOPHAGE ) 500 mg tablet, Take 1,000 mg by mouth., Disp: , Rfl:  .  methocarbamoL  (ROBAXIN ) 500 mg tablet, Take 500 mg by mouth 4 (four) times a day., Disp: , Rfl:  .  metoclopramide (REGLAN) 10 mg tablet, Take 10 mg by mouth daily., Disp: , Rfl:  .  omega 3-dha-epa-fish oil (OMEGA 3) 1,000 mg  capsule, Take 1 g by mouth 3 (three) times a day. (Patient not taking: Reported on 05/10/2024), Disp: , Rfl:  .  omeprazole (PriLOSEC) 40 mg DR capsule, Take 40 mg by mouth Once Daily., Disp: 90 capsule, Rfl: 0 .  oxyCODONE  (ROXICODONE ) 10 mg tab, Take 10 mg by mouth daily as needed for severe pain (7-10)., Disp: , Rfl:  .  pantoprazole  (PROTONIX ) 40 mg EC tablet, Take 40 mg by mouth., Disp: , Rfl:  .  sennosides-docusate sodium  (PERICOLACE) 8.6-50 mg per tablet, Take 1 tablet by mouth Once Daily., Disp: , Rfl:  .  sodium chloride  (OCEAN) 0.65 % nasal spray, Administer into affected nostril(s)., Disp: , Rfl:  .  tadalafiL (CIALIS) 20 mg tablet, Take 20 mg by mouth., Disp: , Rfl:  .  tamsulosin  (FLOMAX ) 0.4 mg cap,  Take 0.4 mg by mouth daily., Disp: , Rfl:  .  testosterone enanthate (DELATESTRYL) 200 mg/mL injection, Inject 200 mg into the muscle every 14 (fourteen) days., Disp: , Rfl:  .  ziprasidone (GEODON) 20 mg capsule, Take 20 mg by mouth in the morning and 20 mg in the evening. Take with meals., Disp: , Rfl:

## 2024-09-10 ENCOUNTER — Observation Stay (HOSPITAL_COMMUNITY)
Admission: RE | Admit: 2024-09-10 | Discharge: 2024-09-12 | Disposition: A | Discharge status: Home / Self Care | Attending: Neurosurgery | Admitting: Neurosurgery

## 2024-09-10 ENCOUNTER — Ambulatory Visit (HOSPITAL_COMMUNITY)

## 2024-09-10 ENCOUNTER — Ambulatory Visit (HOSPITAL_COMMUNITY): Payer: Self-pay | Admitting: Vascular Surgery

## 2024-09-10 ENCOUNTER — Encounter (HOSPITAL_COMMUNITY): Payer: Self-pay

## 2024-09-10 ENCOUNTER — Other Ambulatory Visit: Payer: Self-pay

## 2024-09-10 ENCOUNTER — Encounter (HOSPITAL_COMMUNITY)
Admission: RE | Disposition: A | Discharge status: Home / Self Care | Payer: Self-pay | Source: Home / Self Care | Attending: Neurosurgery

## 2024-09-10 DIAGNOSIS — Z7984 Long term (current) use of oral hypoglycemic drugs: Secondary | ICD-10-CM | POA: Diagnosis not present

## 2024-09-10 DIAGNOSIS — E119 Type 2 diabetes mellitus without complications: Secondary | ICD-10-CM | POA: Insufficient documentation

## 2024-09-10 DIAGNOSIS — I10 Essential (primary) hypertension: Secondary | ICD-10-CM

## 2024-09-10 DIAGNOSIS — M4316 Spondylolisthesis, lumbar region: Principal | ICD-10-CM | POA: Insufficient documentation

## 2024-09-10 DIAGNOSIS — Z7982 Long term (current) use of aspirin: Secondary | ICD-10-CM | POA: Diagnosis not present

## 2024-09-10 DIAGNOSIS — Z87891 Personal history of nicotine dependence: Secondary | ICD-10-CM | POA: Diagnosis not present

## 2024-09-10 DIAGNOSIS — F32A Depression, unspecified: Secondary | ICD-10-CM

## 2024-09-10 DIAGNOSIS — Z79899 Other long term (current) drug therapy: Secondary | ICD-10-CM | POA: Insufficient documentation

## 2024-09-10 DIAGNOSIS — Z01818 Encounter for other preprocedural examination: Secondary | ICD-10-CM

## 2024-09-10 DIAGNOSIS — S32009K Unspecified fracture of unspecified lumbar vertebra, subsequent encounter for fracture with nonunion: Secondary | ICD-10-CM

## 2024-09-10 DIAGNOSIS — M960 Pseudarthrosis after fusion or arthrodesis: Secondary | ICD-10-CM | POA: Diagnosis not present

## 2024-09-10 HISTORY — PX: APPLICATION OF CELL SAVER: SHX7529

## 2024-09-10 HISTORY — PX: TRANSFORAMINAL LUMBAR INTERBODY FUSION (TLIF) WITH PEDICLE SCREW FIXATION 1 LEVEL: SHX6141

## 2024-09-10 LAB — GLUCOSE, CAPILLARY
Glucose-Capillary: 107 mg/dL — ABNORMAL HIGH (ref 70–99)
Glucose-Capillary: 94 mg/dL (ref 70–99)
Glucose-Capillary: 91 mg/dL (ref 70–99)
Glucose-Capillary: 109 mg/dL — ABNORMAL HIGH (ref 70–99)
Glucose-Capillary: 156 mg/dL — ABNORMAL HIGH (ref 70–99)

## 2024-09-10 LAB — PREPARE RBC (CROSSMATCH)

## 2024-09-10 LAB — POCT I-STAT 7, (LYTES, BLD GAS, ICA,H+H)
Acid-Base Excess: 0 mmol/L (ref 0.0–2.0)
Acid-base deficit: 1 mmol/L (ref 0.0–2.0)
Bicarbonate: 24 mmol/L (ref 20.0–28.0)
Bicarbonate: 24.4 mmol/L (ref 20.0–28.0)
Calcium, Ion: 1.21 mmol/L (ref 1.15–1.40)
Calcium, Ion: 1.21 mmol/L (ref 1.15–1.40)
HCT: 26 % — ABNORMAL LOW (ref 39.0–52.0)
HCT: 26 % — ABNORMAL LOW (ref 39.0–52.0)
Hemoglobin: 8.8 g/dL — ABNORMAL LOW (ref 13.0–17.0)
Hemoglobin: 8.8 g/dL — ABNORMAL LOW (ref 13.0–17.0)
O2 Saturation: 100 %
O2 Saturation: 98 %
Patient temperature: 35.2
Potassium: 3.5 mmol/L (ref 3.5–5.1)
Potassium: 4 mmol/L (ref 3.5–5.1)
Sodium: 141 mmol/L (ref 135–145)
Sodium: 139 mmol/L (ref 135–145)
TCO2: 25 mmol/L (ref 22–32)
TCO2: 26 mmol/L (ref 22–32)
pCO2 arterial: 34.6 mmHg (ref 32–48)
pCO2 arterial: 41.6 mmHg (ref 32–48)
pH, Arterial: 7.442 (ref 7.35–7.45)
pH, Arterial: 7.376 (ref 7.35–7.45)
pO2, Arterial: 267 mmHg — ABNORMAL HIGH (ref 83–108)
pO2, Arterial: 107 mmHg (ref 83–108)

## 2024-09-10 LAB — POCT I-STAT, CHEM 8
BUN: 8 mg/dL (ref 8–23)
Calcium, Ion: 1.25 mmol/L (ref 1.15–1.40)
Chloride: 106 mmol/L (ref 98–111)
Creatinine, Ser: 0.6 mg/dL — ABNORMAL LOW (ref 0.61–1.24)
Glucose, Bld: 98 mg/dL (ref 70–99)
HCT: 25 % — ABNORMAL LOW (ref 39.0–52.0)
Hemoglobin: 8.5 g/dL — ABNORMAL LOW (ref 13.0–17.0)
Potassium: 3.5 mmol/L (ref 3.5–5.1)
Sodium: 141 mmol/L (ref 135–145)
TCO2: 25 mmol/L (ref 22–32)

## 2024-09-10 SURGERY — TRANSFORAMINAL LUMBAR INTERBODY FUSION (TLIF) WITH PEDICLE SCREW FIXATION 1 LEVEL
Anesthesia: General

## 2024-09-10 MED ORDER — HYDROMORPHONE HCL 1 MG/ML IJ SOLN
INTRAMUSCULAR | Status: AC
  Filled 2024-09-10: qty 0.5

## 2024-09-10 MED ORDER — CHLORHEXIDINE GLUCONATE 0.12 % MT SOLN
15.0000 mL | Freq: Once | OROMUCOSAL | Status: AC
  Administered 2024-09-10: 15 mL via OROMUCOSAL
  Filled 2024-09-10: qty 15

## 2024-09-10 MED ORDER — PANTOPRAZOLE SODIUM 40 MG PO TBEC
40.0000 mg | DELAYED_RELEASE_TABLET | Freq: Every day | ORAL | Status: DC
  Administered 2024-09-10 – 2024-09-12 (×3): 40 mg via ORAL
  Filled 2024-09-10 (×3): qty 1

## 2024-09-10 MED ORDER — ORAL CARE MOUTH RINSE: 15.0000 mL | Freq: Once | OROMUCOSAL | Status: AC

## 2024-09-10 MED ORDER — ONDANSETRON HCL 4 MG PO TABS: 4.0000 mg | ORAL_TABLET | Freq: Four times a day (QID) | ORAL | Status: DC | PRN

## 2024-09-10 MED ORDER — OXYCODONE HCL 5 MG PO TABS
10.0000 mg | ORAL_TABLET | ORAL | Status: DC | PRN
  Administered 2024-09-10 – 2024-09-12 (×10): 10 mg via ORAL
  Filled 2024-09-10 (×10): qty 2

## 2024-09-10 MED ORDER — HYDRALAZINE HCL 20 MG/ML IJ SOLN
INTRAMUSCULAR | Status: AC
Start: 2024-09-10 — End: 2024-09-10
  Filled 2024-09-10: qty 1

## 2024-09-10 MED ORDER — HYDROMORPHONE HCL 1 MG/ML IJ SOLN
INTRAMUSCULAR | Status: DC | PRN
  Administered 2024-09-10 (×4): .25 mg via INTRAVENOUS

## 2024-09-10 MED ORDER — LIDOCAINE 2% (20 MG/ML) 5 ML SYRINGE
INTRAMUSCULAR | Status: DC | PRN
  Administered 2024-09-10: 100 mg via INTRAVENOUS

## 2024-09-10 MED ORDER — TAMSULOSIN HCL 0.4 MG PO CAPS
0.4000 mg | ORAL_CAPSULE | Freq: Every day | ORAL | Status: DC
  Administered 2024-09-10 – 2024-09-11 (×2): 0.4 mg via ORAL
  Filled 2024-09-10 (×2): qty 1

## 2024-09-10 MED ORDER — ONDANSETRON HCL 4 MG/2ML IJ SOLN
INTRAMUSCULAR | Status: DC | PRN
  Administered 2024-09-10: 4 mg via INTRAVENOUS

## 2024-09-10 MED ORDER — ALBUMIN HUMAN 5 % IV SOLN: INTRAVENOUS | Status: DC | PRN

## 2024-09-10 MED ORDER — BUPIVACAINE LIPOSOME 1.3 % IJ SUSP
INTRAMUSCULAR | Status: DC | PRN
  Administered 2024-09-10: 20 mL

## 2024-09-10 MED ORDER — MENTHOL 3 MG MT LOZG: 1.0000 | LOZENGE | OROMUCOSAL | Status: DC | PRN

## 2024-09-10 MED ORDER — FLEET ENEMA RE ENEM: 1.0000 | ENEMA | Freq: Once | RECTAL | Status: DC | PRN

## 2024-09-10 MED ORDER — EZETIMIBE 10 MG PO TABS
10.0000 mg | ORAL_TABLET | Freq: Every day | ORAL | Status: DC
  Administered 2024-09-10 – 2024-09-12 (×3): 10 mg via ORAL
  Filled 2024-09-10 (×3): qty 1

## 2024-09-10 MED ORDER — ONDANSETRON HCL 4 MG/2ML IJ SOLN: 4.0000 mg | Freq: Four times a day (QID) | INTRAMUSCULAR | Status: DC | PRN

## 2024-09-10 MED ORDER — LACTATED RINGERS IV SOLN: INTRAVENOUS | Status: DC | PRN

## 2024-09-10 MED ORDER — PROPOFOL 1000 MG/100ML IV EMUL
INTRAVENOUS | Status: AC
  Filled 2024-09-10: qty 300

## 2024-09-10 MED ORDER — ACETAMINOPHEN 10 MG/ML IV SOLN
INTRAVENOUS | Status: AC
  Filled 2024-09-10: qty 100

## 2024-09-10 MED ORDER — HYDRALAZINE HCL 20 MG/ML IJ SOLN
INTRAMUSCULAR | Status: DC | PRN
Start: 2024-09-10 — End: 2024-09-10
  Administered 2024-09-10: 20 mg via INTRAVENOUS

## 2024-09-10 MED ORDER — TRANEXAMIC ACID-NACL 1000-0.7 MG/100ML-% IV SOLN
INTRAVENOUS | Status: DC | PRN
Start: 2024-09-10 — End: 2024-09-10
  Administered 2024-09-10: 1000 mg via INTRAVENOUS

## 2024-09-10 MED ORDER — ROCURONIUM BROMIDE 10 MG/ML (PF) SYRINGE
PREFILLED_SYRINGE | INTRAVENOUS | Status: AC
  Filled 2024-09-10: qty 10

## 2024-09-10 MED ORDER — DEXMEDETOMIDINE HCL IN NACL 80 MCG/20ML IV SOLN
INTRAVENOUS | Status: AC
Start: 2024-09-10 — End: 2024-09-10
  Filled 2024-09-10: qty 20

## 2024-09-10 MED ORDER — LACTATED RINGERS IV SOLN: INTRAVENOUS | Status: DC

## 2024-09-10 MED ORDER — FERROUS SULFATE 325 (65 FE) MG PO TABS
325.0000 mg | ORAL_TABLET | Freq: Every day | ORAL | Status: DC
  Administered 2024-09-11 – 2024-09-12 (×2): 325 mg via ORAL
  Filled 2024-09-10 (×2): qty 1

## 2024-09-10 MED ORDER — LABETALOL HCL 5 MG/ML IV SOLN
INTRAVENOUS | Status: AC
  Filled 2024-09-10: qty 4

## 2024-09-10 MED ORDER — METHOCARBAMOL 500 MG PO TABS
500.0000 mg | ORAL_TABLET | Freq: Four times a day (QID) | ORAL | Status: DC | PRN
  Administered 2024-09-10 – 2024-09-12 (×3): 500 mg via ORAL
  Filled 2024-09-10 (×3): qty 1

## 2024-09-10 MED ORDER — PHENYLEPHRINE HCL-NACL 20-0.9 MG/250ML-% IV SOLN
INTRAVENOUS | Status: AC
  Filled 2024-09-10: qty 250

## 2024-09-10 MED ORDER — THROMBIN 5000 UNITS EX SOLR
OROMUCOSAL | Status: DC | PRN
  Administered 2024-09-10: 5 mL via TOPICAL

## 2024-09-10 MED ORDER — SODIUM CHLORIDE 0.9 % IV SOLN: 250.0000 mL | INTRAVENOUS | Status: AC

## 2024-09-10 MED ORDER — LABETALOL HCL 5 MG/ML IV SOLN
10.0000 mg | Freq: Once | INTRAVENOUS | Status: AC
  Administered 2024-09-10: 10 mg via INTRAVENOUS

## 2024-09-10 MED ORDER — SODIUM CHLORIDE 0.9% FLUSH: 3.0000 mL | INTRAVENOUS | Status: DC | PRN

## 2024-09-10 MED ORDER — ROCURONIUM BROMIDE 10 MG/ML (PF) SYRINGE
PREFILLED_SYRINGE | INTRAVENOUS | Status: DC | PRN
  Administered 2024-09-10: 30 mg via INTRAVENOUS
  Administered 2024-09-10: 100 mg via INTRAVENOUS
  Administered 2024-09-10: 40 mg via INTRAVENOUS
  Administered 2024-09-10: 20 mg via INTRAVENOUS
  Administered 2024-09-10: 30 mg via INTRAVENOUS

## 2024-09-10 MED ORDER — VANCOMYCIN HCL 1000 MG IV SOLR
INTRAVENOUS | Status: AC
  Filled 2024-09-10: qty 20

## 2024-09-10 MED ORDER — LIDOCAINE 2% (20 MG/ML) 5 ML SYRINGE
INTRAMUSCULAR | Status: AC
  Filled 2024-09-10: qty 5

## 2024-09-10 MED ORDER — ACETAMINOPHEN 325 MG PO TABS
650.0000 mg | ORAL_TABLET | ORAL | Status: DC | PRN
  Administered 2024-09-10: 650 mg via ORAL
  Filled 2024-09-10: qty 2

## 2024-09-10 MED ORDER — BUPIVACAINE LIPOSOME 1.3 % IJ SUSP
INTRAMUSCULAR | Status: AC
  Filled 2024-09-10: qty 20

## 2024-09-10 MED ORDER — INSULIN ASPART 100 UNIT/ML IJ SOLN: 0.0000 [IU] | Freq: Every day | INTRAMUSCULAR | Status: DC

## 2024-09-10 MED ORDER — INSULIN ASPART 100 UNIT/ML IJ SOLN: 0.0000 [IU] | INTRAMUSCULAR | Status: DC | PRN

## 2024-09-10 MED ORDER — LIDOCAINE-EPINEPHRINE 1 %-1:100000 IJ SOLN
INTRAMUSCULAR | Status: AC
  Filled 2024-09-10: qty 1

## 2024-09-10 MED ORDER — ONDANSETRON HCL 4 MG/2ML IJ SOLN: 4.0000 mg | Freq: Once | INTRAMUSCULAR | Status: DC | PRN

## 2024-09-10 MED ORDER — PROPOFOL 10 MG/ML IV BOLUS
INTRAVENOUS | Status: DC | PRN
  Administered 2024-09-10: 150 mg via INTRAVENOUS
  Administered 2024-09-10: 40 mg via INTRAVENOUS
  Administered 2024-09-10: 150 ug/kg/min via INTRAVENOUS

## 2024-09-10 MED ORDER — DULOXETINE HCL 60 MG PO CPEP
60.0000 mg | ORAL_CAPSULE | Freq: Every day | ORAL | Status: DC
  Administered 2024-09-10 – 2024-09-12 (×3): 60 mg via ORAL
  Filled 2024-09-10 (×3): qty 1

## 2024-09-10 MED ORDER — THROMBIN 5000 UNITS EX KIT
PACK | CUTANEOUS | Status: AC
  Filled 2024-09-10: qty 1

## 2024-09-10 MED ORDER — DEXAMETHASONE SODIUM PHOSPHATE 10 MG/ML IJ SOLN
INTRAMUSCULAR | Status: DC | PRN
  Administered 2024-09-10: 4 mg via INTRAVENOUS

## 2024-09-10 MED ORDER — NEURIVA PLUS PO CAPS: ORAL_CAPSULE | Freq: Every day | ORAL | Status: DC

## 2024-09-10 MED ORDER — ONDANSETRON HCL 4 MG/2ML IJ SOLN
INTRAMUSCULAR | Status: AC
  Filled 2024-09-10: qty 2

## 2024-09-10 MED ORDER — PHENOL 1.4 % MT LIQD: 1.0000 | OROMUCOSAL | Status: DC | PRN

## 2024-09-10 MED ORDER — PROPOFOL 1000 MG/100ML IV EMUL
INTRAVENOUS | Status: AC
  Filled 2024-09-10: qty 500

## 2024-09-10 MED ORDER — DOCUSATE SODIUM 100 MG PO CAPS
100.0000 mg | ORAL_CAPSULE | Freq: Two times a day (BID) | ORAL | Status: DC
  Administered 2024-09-10 – 2024-09-12 (×4): 100 mg via ORAL
  Filled 2024-09-10 (×4): qty 1

## 2024-09-10 MED ORDER — LABETALOL HCL 5 MG/ML IV SOLN
INTRAVENOUS | Status: DC | PRN
Start: 2024-09-10 — End: 2024-09-10
  Administered 2024-09-10 (×3): 5 mg via INTRAVENOUS

## 2024-09-10 MED ORDER — BUPIVACAINE HCL (PF) 0.5 % IJ SOLN
INTRAMUSCULAR | Status: AC
  Filled 2024-09-10: qty 30

## 2024-09-10 MED ORDER — ARIPIPRAZOLE 2 MG PO TABS
2.0000 mg | ORAL_TABLET | Freq: Every day | ORAL | Status: DC
  Administered 2024-09-10 – 2024-09-12 (×3): 2 mg via ORAL
  Filled 2024-09-10 (×5): qty 1

## 2024-09-10 MED ORDER — HYDROMORPHONE HCL 1 MG/ML IJ SOLN: 0.5000 mg | INTRAMUSCULAR | Status: DC | PRN

## 2024-09-10 MED ORDER — CEFAZOLIN SODIUM-DEXTROSE 3-4 GM/150ML-% IV SOLN
3.0000 g | INTRAVENOUS | Status: AC
Start: 2024-09-10 — End: 2024-09-10
  Administered 2024-09-10 (×2): 3 g via INTRAVENOUS
  Filled 2024-09-10: qty 150

## 2024-09-10 MED ORDER — PHENYLEPHRINE 80 MCG/ML (10ML) SYRINGE FOR IV PUSH (FOR BLOOD PRESSURE SUPPORT)
PREFILLED_SYRINGE | INTRAVENOUS | Status: AC
Start: 2024-09-10 — End: 2024-09-10
  Filled 2024-09-10: qty 10

## 2024-09-10 MED ORDER — FENTANYL CITRATE (PF) 250 MCG/5ML IJ SOLN
INTRAMUSCULAR | Status: AC
  Filled 2024-09-10: qty 5

## 2024-09-10 MED ORDER — SUGAMMADEX SODIUM 200 MG/2ML IV SOLN
INTRAVENOUS | Status: DC | PRN
  Administered 2024-09-10: 400 mg via INTRAVENOUS

## 2024-09-10 MED ORDER — DEXAMETHASONE SODIUM PHOSPHATE 10 MG/ML IJ SOLN
INTRAMUSCULAR | Status: AC
Start: 2024-09-10 — End: 2024-09-10
  Filled 2024-09-10: qty 1

## 2024-09-10 MED ORDER — FENTANYL CITRATE (PF) 100 MCG/2ML IJ SOLN
INTRAMUSCULAR | Status: AC
  Filled 2024-09-10: qty 2

## 2024-09-10 MED ORDER — ESMOLOL HCL 100 MG/10ML IV SOLN
INTRAVENOUS | Status: AC
Start: 2024-09-10 — End: 2024-09-10
  Filled 2024-09-10: qty 10

## 2024-09-10 MED ORDER — ACETAMINOPHEN 10 MG/ML IV SOLN
INTRAVENOUS | Status: DC | PRN
  Administered 2024-09-10: 1000 mg via INTRAVENOUS

## 2024-09-10 MED ORDER — SODIUM CHLORIDE 0.9% FLUSH
3.0000 mL | Freq: Two times a day (BID) | INTRAVENOUS | Status: DC
  Administered 2024-09-10 – 2024-09-11 (×2): 3 mL via INTRAVENOUS

## 2024-09-10 MED ORDER — ESMOLOL HCL 100 MG/10ML IV SOLN
INTRAVENOUS | Status: DC | PRN
Start: 2024-09-10 — End: 2024-09-10
  Administered 2024-09-10: 50 mg via INTRAVENOUS
  Administered 2024-09-10: 30 mg via INTRAVENOUS
  Administered 2024-09-10: 20 mg via INTRAVENOUS

## 2024-09-10 MED ORDER — INSULIN ASPART 100 UNIT/ML IJ SOLN: 0.0000 [IU] | Freq: Three times a day (TID) | INTRAMUSCULAR | Status: DC

## 2024-09-10 MED ORDER — ACETAMINOPHEN 650 MG RE SUPP: 650.0000 mg | RECTAL | Status: DC | PRN

## 2024-09-10 MED ORDER — 0.9 % SODIUM CHLORIDE (POUR BTL) OPTIME
TOPICAL | Status: DC | PRN
  Administered 2024-09-10: 1000 mL

## 2024-09-10 MED ORDER — LOSARTAN POTASSIUM 25 MG PO TABS
12.5000 mg | ORAL_TABLET | Freq: Every day | ORAL | Status: DC
  Administered 2024-09-10 – 2024-09-12 (×2): 12.5 mg via ORAL
  Filled 2024-09-10 (×2): qty 1

## 2024-09-10 MED ORDER — POLYETHYLENE GLYCOL 3350 17 G PO PACK: 17.0000 g | PACK | Freq: Every day | ORAL | Status: DC | PRN

## 2024-09-10 MED ORDER — PROPOFOL 10 MG/ML IV BOLUS
INTRAVENOUS | Status: AC
  Filled 2024-09-10: qty 20

## 2024-09-10 MED ORDER — VANCOMYCIN HCL 1000 MG IV SOLR
INTRAVENOUS | Status: DC | PRN
Start: 2024-09-10 — End: 2024-09-10
  Administered 2024-09-10: 1000 mg via TOPICAL

## 2024-09-10 MED ORDER — FENTANYL CITRATE (PF) 100 MCG/2ML IJ SOLN
25.0000 ug | INTRAMUSCULAR | Status: DC | PRN
  Administered 2024-09-10 (×2): 50 ug via INTRAVENOUS

## 2024-09-10 MED ORDER — OXYCODONE HCL 5 MG PO TABS
5.0000 mg | ORAL_TABLET | ORAL | Status: DC | PRN
  Administered 2024-09-11: 5 mg via ORAL
  Filled 2024-09-10: qty 1

## 2024-09-10 MED ORDER — LIDOCAINE-EPINEPHRINE 1 %-1:100000 IJ SOLN
INTRAMUSCULAR | Status: DC | PRN
  Administered 2024-09-10: 5 mL

## 2024-09-10 MED ORDER — ACETAMINOPHEN 10 MG/ML IV SOLN: 1000.0000 mg | Freq: Once | INTRAVENOUS | Status: DC | PRN

## 2024-09-10 MED ORDER — DEXMEDETOMIDINE HCL IN NACL 80 MCG/20ML IV SOLN
INTRAVENOUS | Status: DC | PRN
  Administered 2024-09-10 (×2): 8 ug via INTRAVENOUS
  Administered 2024-09-10: 4 ug via INTRAVENOUS

## 2024-09-10 MED ORDER — LACOSAMIDE 50 MG PO TABS
150.0000 mg | ORAL_TABLET | Freq: Two times a day (BID) | ORAL | Status: DC
  Administered 2024-09-10 – 2024-09-12 (×4): 150 mg via ORAL
  Filled 2024-09-10 (×4): qty 3

## 2024-09-10 MED ORDER — CHLORHEXIDINE GLUCONATE CLOTH 2 % EX PADS: 6.0000 | MEDICATED_PAD | Freq: Once | CUTANEOUS | Status: DC

## 2024-09-10 MED ORDER — METHOCARBAMOL 1000 MG/10ML IJ SOLN: 500.0000 mg | Freq: Four times a day (QID) | INTRAMUSCULAR | Status: DC | PRN

## 2024-09-10 MED ORDER — CEFAZOLIN SODIUM-DEXTROSE 2-4 GM/100ML-% IV SOLN
2.0000 g | Freq: Four times a day (QID) | INTRAVENOUS | Status: AC
  Administered 2024-09-10 – 2024-09-11 (×2): 2 g via INTRAVENOUS
  Filled 2024-09-10 (×2): qty 100

## 2024-09-10 MED ORDER — OXYCODONE HCL 5 MG/5ML PO SOLN: 5.0000 mg | Freq: Once | ORAL | Status: DC | PRN

## 2024-09-10 MED ORDER — OXYCODONE HCL 5 MG PO TABS: 5.0000 mg | ORAL_TABLET | Freq: Once | ORAL | Status: DC | PRN

## 2024-09-10 MED ORDER — BUPIVACAINE HCL (PF) 0.5 % IJ SOLN
INTRAMUSCULAR | Status: DC | PRN
  Administered 2024-09-10: 5 mL
  Administered 2024-09-10: 20 mL

## 2024-09-10 MED ORDER — TRANEXAMIC ACID-NACL 1000-0.7 MG/100ML-% IV SOLN
INTRAVENOUS | Status: AC
  Filled 2024-09-10: qty 100

## 2024-09-10 MED ORDER — ATORVASTATIN CALCIUM 80 MG PO TABS
80.0000 mg | ORAL_TABLET | Freq: Every day | ORAL | Status: DC
  Administered 2024-09-10 – 2024-09-12 (×3): 80 mg via ORAL
  Filled 2024-09-10 (×3): qty 1

## 2024-09-10 MED ORDER — PHENYLEPHRINE HCL-NACL 20-0.9 MG/250ML-% IV SOLN
INTRAVENOUS | Status: DC | PRN
  Administered 2024-09-10: 20 ug/min via INTRAVENOUS

## 2024-09-10 MED ORDER — CEFAZOLIN SODIUM 1 G IJ SOLR
INTRAMUSCULAR | Status: AC
  Filled 2024-09-10: qty 30

## 2024-09-10 MED ORDER — FENTANYL CITRATE (PF) 250 MCG/5ML IJ SOLN
INTRAMUSCULAR | Status: DC | PRN
  Administered 2024-09-10: 50 ug via INTRAVENOUS
  Administered 2024-09-10 (×2): 100 ug via INTRAVENOUS

## 2024-09-10 SURGICAL SUPPLY — 81 items
BAG COUNTER SPONGE SURGICOUNT (BAG) ×1 IMPLANT
BASKET BONE COLLECTION (BASKET) IMPLANT
BLADE CLIPPER SURG (BLADE) IMPLANT
BUR 14 MATCH 3 (BUR) IMPLANT
BUR CARBIDE MATCH 3.0 (BURR) IMPLANT
BUR MATCHSTICK NEURO 3.0 LAGG (BURR) ×1 IMPLANT
BUR MR8 14 BALL 5 (BUR) IMPLANT
BUR PRECISION FLUTE 5.0 (BURR) IMPLANT
CANISTER SUCTION 3000ML PPV (SUCTIONS) ×1 IMPLANT
CNTNR URN SCR LID CUP LEK RST (MISCELLANEOUS) ×1 IMPLANT
COVER BACK TABLE 60X90IN (DRAPES) ×1 IMPLANT
DERMABOND ADVANCED .7 DNX12 (GAUZE/BANDAGES/DRESSINGS) ×1 IMPLANT
DRAIN CHANNEL 10M FLAT 3/4 FLT (DRAIN) IMPLANT
DRAIN JACKSON PRATT 10MM FLAT (MISCELLANEOUS) IMPLANT
DRAPE 3/4 80X56 (DRAPES) ×1 IMPLANT
DRAPE C-ARM 42X72 X-RAY (DRAPES) ×1 IMPLANT
DRAPE C-ARMOR (DRAPES) ×1 IMPLANT
DRAPE LAPAROTOMY 100X72X124 (DRAPES) ×1 IMPLANT
DRAPE MICROSCOPE SLANT 54X150 (MISCELLANEOUS) IMPLANT
DRAPE SHEET LG 3/4 BI-LAMINATE (DRAPES) ×4 IMPLANT
DRSG OPSITE 4X5.5 SM (GAUZE/BANDAGES/DRESSINGS) IMPLANT
DRSG OPSITE POSTOP 3X4 (GAUZE/BANDAGES/DRESSINGS) IMPLANT
DRSG OPSITE POSTOP 4X6 (GAUZE/BANDAGES/DRESSINGS) IMPLANT
DRSG OPSITE POSTOP 4X8 (GAUZE/BANDAGES/DRESSINGS) IMPLANT
DURAPREP 26ML APPLICATOR (WOUND CARE) ×1 IMPLANT
ELECTRODE BLDE INSULATED 6.5IN (ELECTROSURGICAL) ×1 IMPLANT
ELECTRODE REM PT RTRN 9FT ADLT (ELECTROSURGICAL) ×1 IMPLANT
EVACUATOR SILICONE 100CC (DRAIN) IMPLANT
FEE COVERAGE SUPPORT O-ARM (MISCELLANEOUS) ×1 IMPLANT
GAUZE 4X4 16PLY ~~LOC~~+RFID DBL (SPONGE) IMPLANT
GAUZE SPONGE 4X4 12PLY STRL (GAUZE/BANDAGES/DRESSINGS) IMPLANT
GLOVE BIO SURGEON STRL SZ7 (GLOVE) ×4 IMPLANT
GLOVE BIOGEL PI IND STRL 7.0 (GLOVE) IMPLANT
GLOVE BIOGEL PI IND STRL 7.5 (GLOVE) ×2 IMPLANT
GLOVE BIOGEL PI IND STRL 8 (GLOVE) ×4 IMPLANT
GLOVE ECLIPSE 6.5 STRL STRAW (GLOVE) IMPLANT
GLOVE ECLIPSE 8.0 STRL XLNG CF (GLOVE) ×1 IMPLANT
GLOVE EXAM NITRILE XL STR (GLOVE) IMPLANT
GOWN STRL REUS W/ TWL LRG LVL3 (GOWN DISPOSABLE) ×1 IMPLANT
GOWN STRL REUS W/ TWL XL LVL3 (GOWN DISPOSABLE) ×2 IMPLANT
GOWN STRL REUS W/TWL 2XL LVL3 (GOWN DISPOSABLE) IMPLANT
GOWN STRL SURGICAL XL XLNG (GOWN DISPOSABLE) IMPLANT
GRAFT BONE MAGNIFUSE 1X10CM (Bone Implant) IMPLANT
HEMOSTAT POWDER KIT SURGIFOAM (HEMOSTASIS) ×1 IMPLANT
KIT BASIN OR (CUSTOM PROCEDURE TRAY) ×1 IMPLANT
KIT INFUSE SMALL (Orthopedic Implant) IMPLANT
KIT TURNOVER KIT B (KITS) ×1 IMPLANT
MARKER SKIN DUAL TIP RULER LAB (MISCELLANEOUS) ×1 IMPLANT
MARKER SPHERE PSV REFLC NDI (MISCELLANEOUS) ×5 IMPLANT
MILL BONE PREP (MISCELLANEOUS) ×1 IMPLANT
NDL HYPO 18GX1.5 BLUNT FILL (NEEDLE) IMPLANT
NDL HYPO 21X1.5 SAFETY (NEEDLE) IMPLANT
NDL HYPO 22X1.5 SAFETY MO (MISCELLANEOUS) ×1 IMPLANT
NDL SPNL 18GX3.5 QUINCKE PK (NEEDLE) IMPLANT
NEEDLE HYPO 18GX1.5 BLUNT FILL (NEEDLE) IMPLANT
NEEDLE HYPO 21X1.5 SAFETY (NEEDLE) IMPLANT
NEEDLE HYPO 22X1.5 SAFETY MO (MISCELLANEOUS) ×1 IMPLANT
NEEDLE SPNL 18GX3.5 QUINCKE PK (NEEDLE) IMPLANT
NS IRRIG 1000ML POUR BTL (IV SOLUTION) ×1 IMPLANT
PACK LAMINECTOMY NEURO (CUSTOM PROCEDURE TRAY) ×1 IMPLANT
PAD ARMBOARD POSITIONER FOAM (MISCELLANEOUS) ×3 IMPLANT
PIN BONE FIX 100 (PIN) IMPLANT
ROD LUMBAR CRVD SOLERA 5.5X55 (Rod) IMPLANT
ROD PED SOLERA 5.5X60 (Rod) IMPLANT
SCREW MAS/SET HEAD FOR 5.5 ROD (Screw) IMPLANT
SCREW MAS/SET STERILE 4PK (Screw) IMPLANT
SCREW SHANK NANO TITAN 8.5X55 (Screw) IMPLANT
SCREW SHANK NL SS DT 7.5X55 (Screw) IMPLANT
SCREW SHANK NL SS DT 7.5X60 (Screw) IMPLANT
SEALER BIPOLAR AQUA 6.0 (INSTRUMENTS) ×1 IMPLANT
SPIKE FLUID TRANSFER (MISCELLANEOUS) ×1 IMPLANT
SPONGE SURGIFOAM ABS GEL 100 (HEMOSTASIS) IMPLANT
SPONGE T-LAP 4X18 ~~LOC~~+RFID (SPONGE) IMPLANT
SUT MNCRL AB 4-0 PS2 18 (SUTURE) ×1 IMPLANT
SUT VIC AB 0 CT1 18XCR BRD8 (SUTURE) ×1 IMPLANT
SUT VIC AB 2-0 CP2 18 (SUTURE) ×1 IMPLANT
SYR 30ML LL (SYRINGE) ×1 IMPLANT
TOWEL GREEN STERILE (TOWEL DISPOSABLE) ×1 IMPLANT
TOWEL GREEN STERILE FF (TOWEL DISPOSABLE) ×1 IMPLANT
TRAY FOLEY MTR SLVR 16FR STAT (SET/KITS/TRAYS/PACK) ×1 IMPLANT
WATER STERILE IRR 1000ML POUR (IV SOLUTION) ×1 IMPLANT

## 2024-09-10 NOTE — Anesthesia Procedure Notes (Signed)
 Procedure Name: Intubation Date/Time: 09/10/2024 11:15 AM  Performed by: Jerl Donald LABOR, CRNAPre-anesthesia Checklist: Patient identified, Emergency Drugs available, Suction available and Patient being monitored Patient Re-evaluated:Patient Re-evaluated prior to induction Oxygen Delivery Method: Circle system utilized Preoxygenation: Pre-oxygenation with 100% oxygen Induction Type: IV induction Ventilation: Mask ventilation without difficulty Laryngoscope Size: Mac and 4 Grade View: Grade I Tube type: Oral Tube size: 8.0 mm Number of attempts: 1 Airway Equipment and Method: Stylet and Oral airway Placement Confirmation: ETT inserted through vocal cords under direct vision, positive ETCO2 and breath sounds checked- equal and bilateral Secured at: 24 cm Tube secured with: Tape Dental Injury: Teeth and Oropharynx as per pre-operative assessment

## 2024-09-10 NOTE — Op Note (Signed)
 PREOP DIAGNOSIS: L4-5 grade 2 spondylolisthesis and pseudoarthrosis   POSTOP DIAGNOSIS: L4-5 grade 2 spondylolisthesis and pseudoarthrosis   PROCEDURE: 1.  Exploration of previous L4-5 posterior instrumentation, removal of L4 and L5 pedicle screw and rods 2.  L3-4, L4-5 posterolateral arthrodesis 3.  Segmental instrumentation with pedicle screw/rod construct L3-L4-L5 4. Use of morselized allograft 5.  Intraoperative CT and neuronavigation with Medtronic Stealth   SURGEON: Dr. Dorn Ned, MD   ASSISTANT: Camie Pickle, PA.  Please note, there were no qualified trainees available to assist with the procedure.  An assistant was required for aid in retraction of the neural elements.   ANESTHESIA: General Endotracheal   EBL: 300 mL   IMPLANTS:  Medtronic 7.5 x 55 mm screws at L3 bilaterally 7.5 x 55 mm screw at right L4 8.5 x 55 mm screw at left L5, 7.5 x 60 mm screw at right L5 60 mm / 55 mm rods Magnifuse and small BMP   SPECIMENS: None   DRAINS: 10 flat JP   COMPLICATIONS: None   CONDITION: Stable to PACU   HISTORY: This is a Brian Vazquez who underwent L3-4 posterior decompression and L4-5 PLIF for grade 2 spondylolisthesis and severe stenosis last year.  Initially he did well but then developed worsening mechanical back pain.  He was found to have fractured his L4 and L5 pedicle screws on the left side and loose right L4 pedicle screw.  His spondylolisthesis, previously reduced to mild grade 1, had also worsened.  Physical therapy failed to improve his symptoms.  Treatment options were discussed and the patient elected to proceed with posterior lumbar revision fusion L3-4-5.   risks, benefits, alternatives, and expected convalescence were discussed with the patient.  Risks discussed included but were not limited to bleeding, pain, infection, scar, pseudoarthrosis, CSF leak, neurologic deficit, paralysis, and death.  The patient wished to proceed with surgery and informed  consent was obtained.   PROCEDURE IN DETAIL: After informed consent was obtained and witnessed, the patient was brought to the operating room. After induction of general anesthesia, the patient was positioned on the operative table in the prone position on a open Jackson table with all pressure points meticulously padded. The skin of the low back was then prepped and draped in the usual sterile fashion.  1% lidocaine  with epinephrine  was injected in the skin and a timeout was performed.  Preoperative antibiotics were administered.  Brief incision was opened with a 10 blade and was extended superiorly.  Subcutaneous tissue and the fascia was incised with monopolar electrocautery.  The paraspinous muscles and scar tissue were dissected off the remaining lamina of L3 in subperiosteal fashion and previous L4 and L5 pedicle screw and rod construct was dissected out of the scar.  The screw caps and rods were removed and pedicle screws were then removed on the right side.  On the left side, the left L4 screw shank was fractured within the pedicle.  The left L5 screw shank was fractured at the bone interface.  It was able to be removed by drilling a bony trough around it and reverse threading it out of the bone.  Additional dissection continued out laterally, exposing the transverse processes of L3, L4, and L5 bilaterally which were then decorticated. PSIS pin was placed percutaneously and intraoperative CT was performed with O-arm.  Using neuronavigation, navigated drill was used to drill pilot holes for pedicle screws at L3 bilaterally, right L4, and L5 bilaterally.  Navigated awl tip tap was used to  cannulate the pedicles and ball ended feeler confirmed good bony channels.  Navigated shanks were then placed with good purchase at each level.  Decortication of remaining facet was then performed. Final AP and lateral C-arm x-ray and a second O-arm spin showed good placement of the implants.   Rods were placed bilaterally  and secured with screw caps and final tightened.   Allograft was placed in the lateral gutters between the transverse processes bilaterally.  A 10 flat JP drain was placed in the subfascial space and tunneled out the skin and secured with a stitch.  PSIS pin was removed.   Wound was irrigated thoroughly.  Exparel  mixed with Marcaine  was injected into the paraspinous muscles and subcutaneous tissues bilaterally.  Vancomycin  powder was sprinkled into the wound.  The fascia was closed with 0 Vicryl stitches.  The dermal layer was closed with 2-0 Vicryl stitches in buried interrupted fashion.  The skin incisions were closed with 4-0 Monocryl subcuticular manner followed by Dermabond.  Sterile dressings were placed.  Patient was then flipped supine and extubated by the anesthesia service following commands and all 4 extremities.  All counts were correct at the end of surgery.  No complications were noted.

## 2024-09-10 NOTE — H&P (Signed)
 CC: back pain  HPI:     Patient is a 73 y.o. male with hx of L3-4 decompression, L4-5 PLIF for grade 2 spondylolisthesis and lumbar stenosis developed pseudoarthrosis and increased mechanical back pain despite maximal medical and physical therapy.     Patient Active Problem List   Diagnosis Date Noted   Other spondylosis with radiculopathy, lumbar region 08/01/2023   Tongue lesion 10/15/2020   Chronic rhinitis 10/15/2020   Food intolerance 10/15/2020   Past Medical History:  Diagnosis Date   Allergies    Anemia    Arthritis    Chronic pain    Diabetes mellitus (HCC)    Dyspnea    GERD (gastroesophageal reflux disease)    HTN (hypertension)    Hyperlipidemia    Hypogonadism in male    Neuromuscular disorder (HCC)    neuropathy   Pancreatic cyst    Severe recurrent major depression with psychotic features (HCC)    Sleep apnea    wears cpap    Past Surgical History:  Procedure Laterality Date   CATARACT EXTRACTION, BILATERAL     CHOLECYSTECTOMY     patient states it wasn't removed but I just don't have one. states he has a little piece but not a full gallbladder.   gastroduodenectomy with Billroth II anastomosis     MENISCUS REPAIR Right    arthroscopic   TONSILLECTOMY     TRANSFORAMINAL LUMBAR INTERBODY FUSION (TLIF) WITH PEDICLE SCREW FIXATION 1 LEVEL N/A 08/01/2023   Procedure: Transforaminal Lumbar Interbody Fusion Lumbar four-five, Lumbar three-four Posterior Decompression  Bilateral  Foraminotomy;  Surgeon: Debby Dorn MATSU, MD;  Location: [facility] OR;  Service: Neurosurgery;  Laterality: N/A;    Medications Prior to Admission  Medication Sig Dispense Refill Last Dose/Taking   acetaminophen  (TYLENOL ) 500 MG tablet Take 1,000 mg by mouth 3 (three) times daily.   09/09/2024   ARIPiprazole  (ABILIFY ) 2 MG tablet Take 2 mg by mouth daily.   09/09/2024   aspirin EC 81 MG tablet Take 81 mg by mouth daily.   Past Month   atorvastatin  (LIPITOR) 80 MG tablet Take 80 mg by  mouth at bedtime.   09/09/2024   celecoxib (CELEBREX) 200 MG capsule Take 200 mg by mouth daily.   Past Month   Cholecalciferol (VITAMIN D3) 50 MCG (2000 UT) CAPS Take 2,000 Units by mouth daily.   Past Week   DULoxetine  (CYMBALTA ) 60 MG capsule Take 60 mg by mouth daily.   Past Month   ezetimibe  (ZETIA ) 10 MG tablet Take 10 mg by mouth daily.   Past Month   ferrous sulfate  325 (65 FE) MG tablet Take 325 mg by mouth daily with breakfast.   09/09/2024   fluticasone (FLONASE) 50 MCG/ACT nasal spray Place 2 sprays into both nostrils daily as needed for allergies.   09/09/2024   lacosamide  (VIMPAT ) 50 MG TABS tablet Take 50 mg by mouth 2 (two) times daily.   09/07/2024   loratadine (CLARITIN) 10 MG tablet Take 10 mg by mouth daily.   Past Week   losartan  (COZAAR ) 25 MG tablet Take 12.5 mg by mouth daily.   Past Week   metFORMIN  (GLUCOPHAGE ) 500 MG tablet Take 500-1,000 mg by mouth See admin instructions. Take 1000 mg by mouth in the morning, take 500 mg at noon, and 1000 mg in the evening   09/09/2024   pantoprazole  (PROTONIX ) 40 MG tablet Take 40 mg by mouth daily.   09/09/2024   sennosides-docusate sodium  (SENOKOT-S) 8.6-50 MG tablet Take  1 tablet by mouth in the morning and at bedtime.   09/09/2024   tadalafil (CIALIS) 20 MG tablet Take 20 mg by mouth daily as needed for erectile dysfunction.   09/09/2024   tamsulosin  (FLOMAX ) 0.4 MG CAPS capsule Take 0.4 mg by mouth at bedtime.   09/09/2024   Cobalamin Combinations (NEURIVA PLUS PO) Take 1 tablet by mouth daily.      methocarbamol  (ROBAXIN ) 500 MG tablet Take 1 tablet (500 mg total) by mouth every 6 (six) hours as needed for muscle spasms. (Patient not taking: Reported on 09/03/2024) 120 tablet 2 Not Taking   Oxycodone  HCl 10 MG TABS Take 1 tablet (10 mg total) by mouth every 4 (four) hours as needed ((score 7 to 10)). (Patient not taking: Reported on 09/03/2024) 50 tablet 0 Not Taking   No Known Allergies  Social History   Tobacco Use   Smoking status:  Former    Current packs/day: 0.00    Types: Cigarettes    Start date: 4    Quit date: 1977    Years since quitting: 48.7   Smokeless tobacco: Never   Tobacco comments:    1-2 ppd x 20 years  Substance Use Topics   Alcohol use: Never    Family History  Problem Relation Age of Onset   Diabetes Mother    Diabetes Brother    Neuropathy Neg Hx      Review of Systems Pertinent items noted in HPI and remainder of comprehensive ROS otherwise negative.  Objective:   Patient Vitals for the past 8 hrs:  BP Temp Temp src Pulse Resp SpO2 Height Weight  09/10/24 0832 139/84 98.7 F (37.1 C) Oral 83 18 97 % 6' 6 (1.981 m) 124.7 kg   No intake/output data recorded. No intake/output data recorded.      General : Alert, cooperative, no distress, appears stated age   Head:  Normocephalic/atraumatic    Eyes: PERRL, conjunctiva/corneas clear, EOM's intact. Fundi could not be visualized Neck: Supple Chest:  Respirations unlabored Chest wall: no tenderness or deformity Heart: Regular rate and rhythm Abdomen: Soft, nontender and nondistended Extremities: warm and well-perfused Skin: normal turgor, color and texture Neurologic:  Alert, oriented x 3.  Eyes open spontaneously. PERRL, EOMI, VFC, no facial droop. V1-3 intact.  No dysarthria, tongue protrusion symmetric.  CNII-XII intact. Normal strength, sensation and reflexes throughout.  No pronator drift, full strength in legs.  Well-healed incision       Data ReviewCBC:  Lab Results  Component Value Date   WBC 2.9 (L) 09/05/2024   RBC 3.91 (L) 09/05/2024   BMP:  Lab Results  Component Value Date   GLUCOSE 153 (H) 09/05/2024   CO2 25 09/05/2024   BUN 11 09/05/2024   BUN 18 02/24/2023   CREATININE 0.82 09/05/2024   CALCIUM  9.4 09/05/2024   Radiology review:   See clinic note for details  Assessment:   Active Problems:   * No active hospital problems. *  L4-5 pseudoarthrosis  Plan:   - plan for revision fusion  surgery - Risks, benefits, alternatives, and expected convalescence were discussed with her.  Risks discussed included, but were not limited to bleeding, pain, infection, scar, spinal fluid leak, neurologic deficit, instability, pseudoarthrosis, damage to nearby organs, and death.  Informed consent was obtained.  Possibility of blood transfusion also discussed. Risks, benefits discussed.  He consented to blood products as needed.

## 2024-09-10 NOTE — Anesthesia Procedure Notes (Addendum)
 Arterial Line Insertion Start/End9/16/2025 10:00 AM, 09/10/2024 10:05 AM Performed by: Keneth Lynwood POUR, MD, Jerl Donald LABOR, CRNA, CRNA  Patient location: OR. Preanesthetic checklist: patient identified, IV checked, site marked, risks and benefits discussed, surgical consent, monitors and equipment checked, pre-op evaluation, timeout performed and anesthesia consent Lidocaine  1% used for infiltration Left, radial was placed Catheter size: 20 G Hand hygiene performed  and Seldinger technique used  Attempts: 1 Procedure performed without using ultrasound guided technique.

## 2024-09-10 NOTE — Anesthesia Preprocedure Evaluation (Addendum)
 Anesthesia Evaluation  Patient identified by MRN, date of birth, ID band Patient awake    Reviewed: Allergy & Precautions, NPO status , Patient's Chart, lab work & pertinent test results, reviewed documented beta blocker date and time   History of Anesthesia Complications Negative for: history of anesthetic complications  Airway Mallampati: II  TM Distance: >3 FB     Dental  (+) Edentulous Upper, Edentulous Lower   Pulmonary shortness of breath and with exertion, sleep apnea , neg COPD, former smoker   breath sounds clear to auscultation       Cardiovascular hypertension, (-) CAD, (-) Past MI, (-) Cardiac Stents and (-) CABG  Rhythm:Regular Rate:Normal     Neuro/Psych  PSYCHIATRIC DISORDERS  Depression     Neuromuscular disease    GI/Hepatic ,GERD  ,,(+) neg Cirrhosis        Endo/Other  diabetes    Renal/GU Renal disease     Musculoskeletal  (+) Arthritis , Osteoarthritis,    Abdominal   Peds  Hematology  (+) Blood dyscrasia, anemia   Anesthesia Other Findings   Reproductive/Obstetrics                              Anesthesia Physical Anesthesia Plan  ASA: 3  Anesthesia Plan: General   Post-op Pain Management:    Induction: Intravenous  PONV Risk Score and Plan: 2 and Ondansetron  and Dexamethasone   Airway Management Planned: Oral ETT  Additional Equipment: Arterial line  Intra-op Plan:   Post-operative Plan: Extubation in OR  Informed Consent: I have reviewed the patients History and Physical, chart, labs and discussed the procedure including the risks, benefits and alternatives for the proposed anesthesia with the patient or authorized representative who has indicated his/her understanding and acceptance.     Dental advisory given  Plan Discussed with: CRNA  Anesthesia Plan Comments:          Anesthesia Quick Evaluation

## 2024-09-10 NOTE — Progress Notes (Deleted)
 Patient brought CPAP machine from home and placed self on for the evening.

## 2024-09-10 NOTE — Transfer of Care (Signed)
 Immediate Anesthesia Transfer of Care Note  Patient: Brian Vazquez  Procedure(s) Performed: Exploration and revision of Lumbar Four-Five posterior fusion, extension of posterior fusion Lumbar Three-Four APPLICATION OF O-ARM APPLICATION OF CELL SAVER  Patient Location: PACU  Anesthesia Type:General  Level of Consciousness: awake, oriented, and drowsy  Airway & Oxygen Therapy: Patient Spontanous Breathing and Patient connected to face mask oxygen  Post-op Assessment: Report given to RN, Post -op Vital signs reviewed and stable, and Patient moving all extremities  Post vital signs: Reviewed and stable  Last Vitals:  Vitals Value Taken Time  BP 112/62  (77) 09/10/24 16:48  Temp 97.3 09/10/24      1650  Pulse 90 09/10/24    1650  Resp 14 09/10/24 16:52  SpO2 100 09/10/24    1650  Vitals shown include unfiled device data.  Last Pain:  Vitals:   09/10/24 0903  TempSrc:   PainSc: 10-Worst pain ever      Patients Stated Pain Goal: 5 (09/10/24 0903)  Complications: No notable events documented.

## 2024-09-11 DIAGNOSIS — M4316 Spondylolisthesis, lumbar region: Secondary | ICD-10-CM | POA: Diagnosis not present

## 2024-09-11 LAB — GLUCOSE, CAPILLARY
Glucose-Capillary: 83 mg/dL (ref 70–99)
Glucose-Capillary: 116 mg/dL — ABNORMAL HIGH (ref 70–99)
Glucose-Capillary: 144 mg/dL — ABNORMAL HIGH (ref 70–99)
Glucose-Capillary: 112 mg/dL — ABNORMAL HIGH (ref 70–99)

## 2024-09-11 LAB — CBC
HCT: 28.9 % — ABNORMAL LOW (ref 39.0–52.0)
Hemoglobin: 9.1 g/dL — ABNORMAL LOW (ref 13.0–17.0)
MCH: 25.2 pg — ABNORMAL LOW (ref 26.0–34.0)
MCHC: 31.5 g/dL (ref 30.0–36.0)
MCV: 80.1 fL (ref 80.0–100.0)
Platelets: 218 K/uL (ref 150–400)
RBC: 3.61 MIL/uL — ABNORMAL LOW (ref 4.22–5.81)
RDW: 15.7 % — ABNORMAL HIGH (ref 11.5–15.5)
WBC: 4.7 K/uL (ref 4.0–10.5)
nRBC: 0 % (ref 0.0–0.2)

## 2024-09-11 LAB — BASIC METABOLIC PANEL WITH GFR
Anion gap: 17 — ABNORMAL HIGH (ref 5–15)
BUN: 9 mg/dL (ref 8–23)
CO2: 24 mmol/L (ref 22–32)
Calcium: 9.7 mg/dL (ref 8.9–10.3)
Chloride: 99 mmol/L (ref 98–111)
Creatinine, Ser: 1.16 mg/dL (ref 0.61–1.24)
GFR, Estimated: >60 mL/min (ref >60)
Glucose, Bld: 171 mg/dL — ABNORMAL HIGH (ref 70–99)
Potassium: 3.7 mmol/L (ref 3.5–5.1)
Sodium: 140 mmol/L (ref 135–145)

## 2024-09-11 LAB — HEMOGLOBIN A1C
Hgb A1c MFr Bld: 6.2 % — ABNORMAL HIGH (ref 4.8–5.6)
Mean Plasma Glucose: 131.24 mg/dL

## 2024-09-11 MED ORDER — METFORMIN HCL 500 MG PO TABS
500.0000 mg | ORAL_TABLET | Freq: Every day | ORAL | Status: DC
  Administered 2024-09-11 – 2024-09-12 (×2): 500 mg via ORAL
  Filled 2024-09-11 (×2): qty 1

## 2024-09-11 MED ORDER — METFORMIN HCL 500 MG PO TABS
1000.0000 mg | ORAL_TABLET | Freq: Two times a day (BID) | ORAL | Status: DC
  Administered 2024-09-11 – 2024-09-12 (×3): 1000 mg via ORAL
  Filled 2024-09-11 (×3): qty 2

## 2024-09-11 NOTE — Progress Notes (Signed)
 Physical Therapy Treatment  Patient Details Name: Brian Vazquez MRN: [id number] DOB: 11/18/1951 Today's Date: 09/11/2024   History of Present Illness Pt is a 73 y/o male who presented 09/10/24 due to back pain. Pt s/p removal of L4 and L5 pedicle screw and rods and L3-4, L4-5 posterolateral arthrodesis. PMH significant for arthritis, chronic pain, DM, HTN, neuropathy, major depression with psychotic feature, sleep apnea on cpap, meniscus repair, TLIF L4-5 L3-4.    PT Comments  Pt reports feeling better on second PT attempt, and was motivated to participate. Noted pt with supplemental O2 donned for comfort upon entry. SpO2 remained >92% throughout session on RA. Reviewed handout and pt/daughter educated on precautions. They are both interested in getting a hospital bed at d/c for positioning as pt spends most of his time in a flat bed at home. Will continue to follow and progress as able per POC.   Orthostatic BPs  Supine HOB 56 106/48   Sitting 95/57 mildly symptomatic  Standing 90/53 mildly symptomatic  Standing after ambulation in hall with PT 118/62       If plan is discharge home, recommend the following: A little help with walking and/or transfers;A little help with bathing/dressing/bathroom;Assistance with cooking/housework;Assist for transportation;Help with stairs or ramp for entrance   Can travel by private vehicle        Equipment Recommendations  BSC/3in1;Hospital bed;Rolling walker (2 wheels) (Pt prefers bariatric size for RW)    Recommendations for Other Services       Precautions / Restrictions Precautions Precautions: Fall;Back Precaution Booklet Issued: Yes (comment) Recall of Precautions/Restrictions: Impaired Precaution/Restrictions Comments: Watch BP Required Braces or Orthoses: Spinal Brace Spinal Brace: Lumbar corset;Applied in sitting position Restrictions Weight Bearing Restrictions Per Provider Order: No     Mobility  Bed Mobility Overal bed  mobility: Needs Assistance Bed Mobility: Rolling, Sidelying to Sit, Sit to Sidelying Rolling: Supervision Sidelying to sit: Supervision     Sit to sidelying: Min assist General bed mobility comments: VC's for optimal log roll technique. Assist for LE elevation back up into bed at end of session.    Transfers Overall transfer level: Needs assistance   Transfers: Sit to/from Stand Sit to Stand: Min assist           General transfer comment: VC's for hand placement on seated surface for safety. Assist for power up to full stand.    Ambulation/Gait Ambulation/Gait assistance: Contact guard assist Gait Distance (Feet): 100 Feet Assistive device: Rolling walker (2 wheels) Gait Pattern/deviations: Step-through pattern, Decreased stride length, Trunk flexed Gait velocity: Decreased Gait velocity interpretation: <1.31 ft/sec, indicative of household ambulator   General Gait Details: VC's for improved posture, closer walker proximity and forward gaze.   Stairs             Wheelchair Mobility     Tilt Bed    Modified Rankin (Stroke Patients Only)       Balance Overall balance assessment: Needs assistance Sitting-balance support: Feet supported, Bilateral upper extremity supported Sitting balance-Leahy Scale: Fair     Standing balance support: Single extremity supported, Reliant on assistive device for balance Standing balance-Leahy Scale: Poor Standing balance comment: LE's shaky in static standing but only requiring 1 UE support                            Communication Communication Communication: No apparent difficulties  Cognition Arousal: Alert Behavior During Therapy: WFL for tasks assessed/performed   PT -  Cognitive impairments: No apparent impairments                         Following commands: Impaired Following commands impaired: Only follows one step commands consistently, Follows multi-step commands with increased time     Cueing Cueing Techniques: Verbal cues, Gestural cues  Exercises      General Comments        Pertinent Vitals/Pain Pain Assessment Pain Assessment: Faces Faces Pain Scale: Hurts a little bit Pain Location: Incision site Pain Descriptors / Indicators: Operative site guarding, Sore Pain Intervention(s): Monitored during session, Limited activity within patient's tolerance, Repositioned    Home Living Family/patient expects to be discharged to:: Private residence Living Arrangements: Alone Available Help at Discharge: Family;Available PRN/intermittently Type of Home: House Home Access: Stairs to enter Entrance Stairs-Rails: None Entrance Stairs-Number of Steps: 2+1   Home Layout: One level Home Equipment: Cane - single point;Shower seat;Grab bars - tub/shower      Prior Function            PT Goals (current goals can now be found in the care plan section) Acute Rehab PT Goals Patient Stated Goal: Return home at d/c PT Goal Formulation: With patient Time For Goal Achievement: 09/18/24 Potential to Achieve Goals: Good Progress towards PT goals: Progressing toward goals    Frequency    Min 5X/week      PT Plan      Co-evaluation              AM-PAC PT 6 Clicks Mobility   Outcome Measure  Help needed turning from your back to your side while in a flat bed without using bedrails?: A Little Help needed moving from lying on your back to sitting on the side of a flat bed without using bedrails?: A Little Help needed moving to and from a bed to a chair (including a wheelchair)?: A Little Help needed standing up from a chair using your arms (e.g., wheelchair or bedside chair)?: A Little Help needed to walk in hospital room?: A Little Help needed climbing 3-5 steps with a railing? : A Lot 6 Click Score: 17    End of Session Equipment Utilized During Treatment: Gait belt;Back brace Activity Tolerance: Patient tolerated treatment well Patient left: in  bed;with call bell/phone within reach;with family/visitor present Nurse Communication: Mobility status PT Visit Diagnosis: Unsteadiness on feet (R26.81);Pain Pain - part of body:  (back)     Time: 8850-8775 PT Time Calculation (min) (ACUTE ONLY): 35 min  Charges:    $Gait Training: 23-37 mins PT General Charges $$ ACUTE PT VISIT: 1 Visit                     Leita Sable, PT, DPT Acute Rehabilitation Services Secure Chat Preferred Office: [phone]    Leita JONETTA Sable 09/11/2024, 1:08 PM

## 2024-09-11 NOTE — Anesthesia Postprocedure Evaluation (Signed)
 Anesthesia Post Note  Patient: Brian Vazquez  Procedure(s) Performed: Exploration and revision of Lumbar Four-Five posterior fusion, extension of posterior fusion Lumbar Three-Four APPLICATION OF O-ARM APPLICATION OF CELL SAVER     Patient location during evaluation: PACU Anesthesia Type: General Level of consciousness: awake and alert Pain management: pain level controlled Vital Signs Assessment: post-procedure vital signs reviewed and stable Respiratory status: spontaneous breathing, nonlabored ventilation, respiratory function stable and patient connected to nasal cannula oxygen Cardiovascular status: blood pressure returned to baseline and stable Postop Assessment: no apparent nausea or vomiting Anesthetic complications: no   No notable events documented.       Lynwood MARLA Cornea

## 2024-09-11 NOTE — Progress Notes (Signed)
 Orthopedic Tech Progress Note Patient Details:  Brian Vazquez 1951-03-26 [id number]  Ortho Devices Type of Ortho Device: Lumbar corsett Ortho Device/Splint Location: lumbar Ortho Device/Splint Interventions: Ordered   Post Interventions Instructions Provided: Adjustment of device, Care of device, Poper ambulation with device  [name] 09/11/2024, 6:35 AM

## 2024-09-11 NOTE — Evaluation (Signed)
 Occupational Therapy Evaluation Patient Details Name: Brian Vazquez MRN: [id number] DOB: [birth year] Today's Date: 09/11/2024   History of Present Illness   Pt is a 73 yr old male who presented 09/10/24 due to back pain. Pt  s/p removal of L4 and L5 pedicle screw and rods and L3-4, L4-5 posterolateral arthrodesis. PMH arthritis, chronic pain, DM HTN HLD, neuropathy, major depression with psychotic feature, sleep apnea on cpap, meniscus repair, TLIF L4-5 L3-4     Clinical Impressions Pt reported at PLOF they live alone but daughter can assist some at dc but require to go up 3 steps with no rail. Pt reported limited ambulation and using walking stick. At this time only able to report 1/3 precautions. Pt was limited due to BP as was able to complete supine to sitting with supervision but when at the EOB reported blurry  vsision and feeling dizzy with have difficulties with keeping his eyes open. Pt was placed back into supine and BP  88/35 (35 (51) o2 89-94% HR 90. Nursing aware. Repeat BP 75/33 (47). Acute Occupational Therapy to follow up due to limited session.       If plan is discharge home, recommend the following:   A little help with walking and/or transfers;A little help with bathing/dressing/bathroom;Assistance with cooking/housework;Assist for transportation;Help with stairs or ramp for entrance     Functional Status Assessment   Patient has had a recent decline in their functional status and demonstrates the ability to make significant improvements in function in a reasonable and predictable amount of time.     Equipment Recommendations   BSC/3in1 (RW)     Recommendations for Other Services         Precautions/Restrictions   Precautions Precautions: Back Precaution Booklet Issued: No Recall of Precautions/Restrictions: Impaired Precaution/Restrictions Comments: back/brace Required Braces or Orthoses: Spinal Brace Spinal Brace: Lumbar  corset Restrictions Weight Bearing Restrictions Per Provider Order: No     Mobility Bed Mobility Overal bed mobility: Needs Assistance Bed Mobility: Supine to Sit, Sit to Supine     Supine to sit: Supervision Sit to supine: Supervision   General bed mobility comments: cues on log rolling    Transfers                   General transfer comment: deffered due to bp      Balance Overall balance assessment: Needs assistance Sitting-balance support: Feet supported, Bilateral upper extremity supported Sitting balance-Leahy Scale: Fair Sitting balance - Comments: due to bp                                   ADL either performed or assessed with clinical judgement   ADL Overall ADL's : Needs assistance/impaired Eating/Feeding: Set up;Sitting   Grooming: Set up;Sitting   Upper Body Bathing: Set up;Sitting       Upper Body Dressing : Set up;Standing                           Vision         Perception         Praxis         Pertinent Vitals/Pain Pain Assessment Pain Assessment: Faces Faces Pain Scale: Hurts a little bit Pain Location: sx site Pain Descriptors / Indicators: Aching, Discomfort, Grimacing, Guarding Pain Intervention(s): Limited activity within patient's tolerance, Monitored during session, Repositioned  Extremity/Trunk Assessment             Communication Communication Communication: No apparent difficulties   Cognition Arousal: Lethargic, Suspect due to medications Behavior During Therapy: WFL for tasks assessed/performed Cognition: No apparent impairments             OT - Cognition Comments: Pt very lethargic in session but did confirm PLOF with the chart                 Following commands: Impaired Following commands impaired: Only follows one step commands consistently, Follows multi-step commands with increased time     Cueing  General Comments   Cueing Techniques: Verbal  cues      Exercises     Shoulder Instructions      Home Living Family/patient expects to be discharged to:: Private residence Living Arrangements: Alone Available Help at Discharge: Family;Available PRN/intermittently Type of Home: House Home Access: Stairs to enter [employer] of Steps: 2+1 Entrance Stairs-Rails: None Home Layout: One level     Bathroom Shower/Tub: Chief Strategy Officer: Standard     Home Equipment: Cane - single point;Shower seat;Grab bars - tub/shower          Prior Functioning/Environment Prior Level of Function : Independent/Modified Independent;Driving             Mobility Comments: pt reported limited due to SOB and pain ADLs Comments: MI due to pain    OT Problem List: Decreased strength;Decreased activity tolerance;Impaired balance (sitting and/or standing);Decreased safety awareness;Decreased knowledge of use of DME or AE;Pain   OT Treatment/Interventions: Self-care/ADL training;Therapeutic exercise;DME and/or AE instruction;Therapeutic activities;Patient/family education;Balance training      OT Goals(Current goals can be found in the care plan section)   Acute Rehab OT Goals Patient Stated Goal: none OT Goal Formulation: With patient Time For Goal Achievement: 09/25/24 Potential to Achieve Goals: Fair   OT Frequency:  Min 2X/week    Co-evaluation              AM-PAC OT 6 Clicks Daily Activity     Outcome Measure Help from another person eating meals?: None Help from another person taking care of personal grooming?: None Help from another person toileting, which includes using toliet, bedpan, or urinal?: A Little Help from another person bathing (including washing, rinsing, drying)?: A Little Help from another person to put on and taking off regular upper body clothing?: None Help from another person to put on and taking off regular lower body clothing?: A Little 6 Click Score: 21   End of  Session Nurse Communication: Mobility status  Activity Tolerance: Other (comment) (bp) Patient left: in bed;with call bell/phone within reach;with bed alarm set  OT Visit Diagnosis: Unsteadiness on feet (R26.81);Other abnormalities of gait and mobility (R26.89);Repeated falls (R29.6);Muscle weakness (generalized) (M62.81);Pain Pain - part of body:  (back)                Time: 9261-9198 OT Time Calculation (min): 23 min Charges:  OT General Charges $OT Visit: 1 Visit OT Evaluation $OT Eval Low Complexity: 1 Low OT Treatments $Self Care/Home Management : 8-22 mins  Brian Vazquez  Acute Rehab Services  [phone] office number   Brian Vazquez 09/11/2024, 8:15 AM

## 2024-09-11 NOTE — Evaluation (Signed)
 Physical Therapy Evaluation  Patient Details Name: Brian Vazquez MRN: [id number] DOB: 02/22/1951 Today's Date: 09/11/2024  History of Present Illness  Pt is a 73 y/o male who presented 09/10/24 due to back pain. Pt s/p removal of L4 and L5 pedicle screw and rods and L3-4, L4-5 posterolateral arthrodesis. PMH significant for arthritis, chronic pain, DM, HTN, neuropathy, major depression with psychotic feature, sleep apnea on cpap, meniscus repair, TLIF L4-5 L3-4.   Clinical Impression  Pt admitted with above diagnosis. At the time of PT eval, pt was able to demonstrate transfers to/from EOB with gross supervision for safety. Pt was educated on precautions, however symptomatic orthostatic hypotension limiting mobility tolerance this session. Pt currently with functional limitations due to the deficits listed below (see PT Problem List). Pt will benefit from skilled PT to increase their independence and safety with mobility to allow discharge to the venue listed below.      Orthostatic BPs  Supine 90/45 asymptomatic  Sitting 83/53 symptomatic  Supine HOB 30 end of session 109/63         If plan is discharge home, recommend the following: A little help with walking and/or transfers;A little help with bathing/dressing/bathroom;Assistance with cooking/housework;Assist for transportation;Help with stairs or ramp for entrance   Can travel by private vehicle        Equipment Recommendations Rolling walker (2 wheels) (May progress away from this)  Recommendations for Other Services       Functional Status Assessment Patient has had a recent decline in their functional status and demonstrates the ability to make significant improvements in function in a reasonable and predictable amount of time.     Precautions / Restrictions Precautions Precautions: Fall;Back Precaution Booklet Issued: Yes (comment) Recall of Precautions/Restrictions: Impaired Precaution/Restrictions Comments:  Orthostatic Required Braces or Orthoses: Spinal Brace Spinal Brace: Lumbar corset;Applied in sitting position Restrictions Weight Bearing Restrictions Per Provider Order: No      Mobility  Bed Mobility Overal bed mobility: Needs Assistance Bed Mobility: Rolling, Sidelying to Sit, Sit to Sidelying Rolling: Supervision Sidelying to sit: Supervision     Sit to sidelying: Supervision General bed mobility comments: VC's for optimal log roll technique. No assist required. Increased time due to pain.    Transfers                   General transfer comment: OOB transfers deferred at this time.    Ambulation/Gait                  Stairs            Wheelchair Mobility     Tilt Bed    Modified Rankin (Stroke Patients Only)       Balance Overall balance assessment: Needs assistance Sitting-balance support: Feet supported, Bilateral upper extremity supported Sitting balance-Leahy Scale: Fair         Standing balance comment: Deferred due to orthostatic hypotension                             Pertinent Vitals/Pain Pain Assessment Pain Assessment: Faces Faces Pain Scale: Hurts little more Pain Location: Incision site Pain Descriptors / Indicators: Operative site guarding, Sore Pain Intervention(s): Limited activity within patient's tolerance, Monitored during session, Repositioned    Home Living Family/patient expects to be discharged to:: Private residence Living Arrangements: Alone Available Help at Discharge: Family;Available PRN/intermittently Type of Home: House Home Access: Stairs to enter Entrance Stairs-Rails: None Entrance  Stairs-Number of Steps: 2+1   Home Layout: One level Home Equipment: Cane - single point;Shower seat;Grab bars - tub/shower      Prior Function Prior Level of Function : Independent/Modified Independent;Driving             Mobility Comments: pt reported limited due to SOB and pain ADLs  Comments: MI due to pain     Extremity/Trunk Assessment   Upper Extremity Assessment Upper Extremity Assessment: Defer to OT evaluation    Lower Extremity Assessment Lower Extremity Assessment: Generalized weakness (Mild; consistent with pre-op diagnosis)    Cervical / Trunk Assessment Cervical / Trunk Assessment: Back Surgery  Communication   Communication Communication: No apparent difficulties    Cognition Arousal: Alert Behavior During Therapy: WFL for tasks assessed/performed                             Following commands: Impaired Following commands impaired: Only follows one step commands consistently, Follows multi-step commands with increased time     Cueing Cueing Techniques: Verbal cues, Gestural cues     General Comments      Exercises     Assessment/Plan    PT Assessment Patient needs continued PT services  PT Problem List Decreased strength;Decreased activity tolerance;Decreased balance;Decreased mobility;Decreased knowledge of use of DME;Decreased safety awareness;Decreased knowledge of precautions;Pain       PT Treatment Interventions DME instruction;Gait training;Functional mobility training;Stair training;Therapeutic activities;Therapeutic exercise;Balance training;Patient/family education    PT Goals (Current goals can be found in the Care Plan section)  Acute Rehab PT Goals Patient Stated Goal: Return home at d/c PT Goal Formulation: With patient Time For Goal Achievement: 09/18/24 Potential to Achieve Goals: Good    Frequency Min 5X/week     Co-evaluation               AM-PAC PT 6 Clicks Mobility  Outcome Measure Help needed turning from your back to your side while in a flat bed without using bedrails?: A Little Help needed moving from lying on your back to sitting on the side of a flat bed without using bedrails?: A Little Help needed moving to and from a bed to a chair (including a wheelchair)?: A Little Help  needed standing up from a chair using your arms (e.g., wheelchair or bedside chair)?: A Little Help needed to walk in hospital room?: Total Help needed climbing 3-5 steps with a railing? : Total 6 Click Score: 14    End of Session   Activity Tolerance: Treatment limited secondary to medical complications (Comment) (orthostatic hypotension) Patient left: in bed;with call bell/phone within reach;with bed alarm set Nurse Communication: Mobility status PT Visit Diagnosis: Unsteadiness on feet (R26.81);Pain Pain - part of body:  (back)    Time: 9061-9044 PT Time Calculation (min) (ACUTE ONLY): 17 min   Charges:   PT Evaluation $PT Eval Moderate Complexity: 1 Mod   PT General Charges $$ ACUTE PT VISIT: 1 Visit         Leita Sable, PT, DPT Acute Rehabilitation Services Secure Chat Preferred Office: [phone]   Leita JONETTA Sable 09/11/2024, 10:13 AM

## 2024-09-11 NOTE — Progress Notes (Signed)
    Providing Compassionate, Quality Care - Together   NEUROSURGERY PROGRESS NOTE     S: No issues overnight. Hypotensive this AM while sitting at EOB.    O: EXAM:  BP 133/68 (BP Location: Left Arm)   Pulse 79   Temp 99.1 F (37.3 C) (Oral)   Resp 18   Ht 6' 6 (1.981 m)   Wt 124.7 kg   SpO2 100%   BMI 31.78 kg/m     Awake, alert, oriented  Speech fluent, appropriate  BUE/BLE 5/5 SILTx4 Dressing c/d/I JP in place   ASSESSMENT:  73 y.o. s/p revision/extension lumbar fusion L3-5 for pseudarthrosis    PLAN: -Therapies as tolerated -Continue JP drain -Bolus NS -Call w/ questions/concerns.   Camie Pickle, [facility]

## 2024-09-11 NOTE — TOC Initial Note (Signed)
 Transition of Care [facility]) - Initial/Assessment Note    Patient Details  Name: Brian Vazquez MRN: [id number] Date of Birth: 1950/12/31  [facility] of [facility]) CM/SW Contact:    Nola Devere Hands, RN Phone Number: 09/11/2024, 3:38 PM  Clinical Narrative:                 Patient is 73 yr old gentleman s/p Revision/extension Lumbar fusion L3-5. Patient will need DME for discharge, CM left message with SW at [facility], ext [phone]. Needs RW-Bariatric, and Hospital bed . Home Health therapy has been setup with [facility]. TOC Team will follow.  Expected Discharge Plan: Home w Home Health Services Barriers to Discharge: Continued Medical Work up   Patient Goals and CMS Choice     Choice offered to / list presented to : Patient      Expected Discharge Plan and Services   Discharge Planning Services: CM Consult Post Acute Care Choice: Durable Medical Equipment, Home Health Living arrangements for the past 2 months: Single Family Home                           HH Arranged: PT, OT HH Agency: [facility] Date [facility] Agency Contacted: 09/11/24 Time HH Agency Contacted: 1345 Representative spoke with at [facility] Agency: Darleene Gowda  Prior Living Arrangements/Services Living arrangements for the past 2 months: Single Family Home   Patient language and need for interpreter reviewed:: Yes Do you feel safe going back to the place where you live?: Yes      Need for Family Participation in Patient Care: Yes (Comment) Care giver support system in place?: Yes (comment)   Criminal Activity/Legal Involvement Pertinent to Current Situation/Hospitalization: No - Comment as needed  Activities of Daily Living      Permission Sought/Granted                  Emotional Assessment       Orientation: : Oriented to Self, Oriented to Place, Oriented to  Time, Oriented to Situation Alcohol / Substance Use: Not Applicable Psych Involvement: No  (comment)  Admission diagnosis:  Lumbar pseudoarthrosis [S32.009K] Pseudarthrosis after fusion or arthrodesis [M96.0] Patient Active Problem List   Diagnosis Date Noted   Pseudarthrosis after fusion or arthrodesis 09/10/2024   Other spondylosis with radiculopathy, lumbar region 08/01/2023   Tongue lesion 10/15/2020   Chronic rhinitis 10/15/2020   Food intolerance 10/15/2020   PCP:  Cl[facility], [facility]:   [facility] [address] [place] [address] Phone: [phone] Fax: [phone]     Social Drivers of Health (SDOH) Social History: SDOH Screenings   Social Connections: Unknown (05/13/2022)   Received from [facility]  Tobacco Use: Medium Risk (09/10/2024)   SDOH Interventions:     Readmission Risk Interventions     No data to display

## 2024-09-12 DIAGNOSIS — M4316 Spondylolisthesis, lumbar region: Secondary | ICD-10-CM | POA: Diagnosis not present

## 2024-09-12 LAB — GLUCOSE, CAPILLARY
Glucose-Capillary: 131 mg/dL — ABNORMAL HIGH (ref 70–99)
Glucose-Capillary: 120 mg/dL — ABNORMAL HIGH (ref 70–99)

## 2024-09-12 MED ORDER — DOCUSATE SODIUM 100 MG PO CAPS: 100.0000 mg | ORAL_CAPSULE | Freq: Two times a day (BID) | ORAL | 2 refills | Status: AC | PRN

## 2024-09-12 MED ORDER — OXYCODONE HCL 10 MG PO TABS: 10.0000 mg | ORAL_TABLET | ORAL | 0 refills | Status: AC | PRN

## 2024-09-12 MED ORDER — METHOCARBAMOL 500 MG PO TABS: 500.0000 mg | ORAL_TABLET | Freq: Four times a day (QID) | ORAL | 2 refills | Status: AC | PRN

## 2024-09-12 MED FILL — Heparin Sodium (Porcine) Inj 1000 Unit/ML: INTRAMUSCULAR | Qty: 30 | Status: AC

## 2024-09-12 MED FILL — Sodium Chloride IV Soln 0.9%: INTRAVENOUS | Qty: 2000 | Status: AC

## 2024-09-12 NOTE — Progress Notes (Signed)
 Patient awaiting family for discharge home, Patient in no acute distress nor complaints of pain nor discomfort; incision on back is clean, dry and intact; No c/o pain at this time. Room was checked and accounted for all patient's belongings; discharge instructions concerning his medications, incision care, follow up appointment and when to call the doctor as needed were all discussed with patient and daughter by RN and they both expressed understanding on the instructions given

## 2024-09-12 NOTE — Discharge Instructions (Signed)
 Wound Care Keep incision covered and dry until post op day 3. You may remove the Honeycomb dressing on post op day 3. Leave steri-strips on back.  They will fall off by themselves. Do not put any creams, lotions, or ointments on incision. You are fine to shower. Let water run over incision and pat dry.   Activity Walk each and every day, increasing distance each day. No lifting greater than 8 lbs.  No lifting no bending no twisting no driving . 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 If provided with back brace, wear when out of bed.  It is not necessary to wear brace in bed.  Diet Resume your normal diet.  Call Your Doctor If Any of These Occur Redness, drainage, or swelling at the wound.  Temperature greater than 101 degrees. Severe pain not relieved by pain medication. Incision starts to come apart.  Follow Up Appt Call [phone] if you have one or any problem.

## 2024-09-12 NOTE — Progress Notes (Signed)
 Physical Therapy Treatment  Patient Details Name: Brian Vazquez MRN: [id number] DOB: 1951/05/18 Today's Date: 09/12/2024   History of Present Illness Pt is a 73 y/o male who presented 09/10/24 due to back pain. Pt s/p removal of L4 and L5 pedicle screw and rods and L3-4, L4-5 posterolateral arthrodesis. PMH significant for arthritis, chronic pain, DM, HTN, neuropathy, major depression with psychotic feature, sleep apnea on cpap, meniscus repair, TLIF L4-5 L3-4.    PT Comments  Pt reports pain and weakness today stating I think I need another day before discharge. Pt overall moving slow and requiring gross CGA to min assist, however biggest barrier is entering daughter's home where there are 3 steps to enter without rails. Gait belt issued for safety, but pt heavily reliant on railings during stair training this session. Recommend 2 people be present to provide HHA to pt for stair negotiation, however feel it would be reasonable for pt to have an additional day for PT to practice functional mobility, including stair training.     If plan is discharge home, recommend the following: A little help with walking and/or transfers;A little help with bathing/dressing/bathroom;Assistance with cooking/housework;Assist for transportation;Help with stairs or ramp for entrance   Can travel by private vehicle        Equipment Recommendations  BSC/3in1;Hospital bed;Rolling walker (2 wheels) (Pt prefers bariatric size for RW)    Recommendations for Other Services       Precautions / Restrictions Precautions Precautions: Fall;Back Precaution Booklet Issued: Yes (comment) Recall of Precautions/Restrictions: Impaired Precaution/Restrictions Comments: pt unable to recall any precautions. Reviewed 3/3 precautions with pt throughout functional mobility. Required Braces or Orthoses: Spinal Brace Spinal Brace: Lumbar corset;Applied in sitting position Restrictions Weight Bearing Restrictions Per  Provider Order: No     Mobility  Bed Mobility Overal bed mobility: Needs Assistance Bed Mobility: Rolling, Supine to Sit, Sit to Supine Rolling: Supervision Sidelying to sit: Supervision     Sit to sidelying: Min assist General bed mobility comments: VC's for optimal log roll technique. Assist for LE elevation back up into bed at end of session.    Transfers Overall transfer level: Needs assistance Equipment used: Rolling walker (2 wheels) Transfers: Sit to/from Stand Sit to Stand: Min assist           General transfer comment: VC's for hand placement on seated surface for safety. Assist for power up to full stand. 2 attempts for successful stand.    Ambulation/Gait Ambulation/Gait assistance: Contact guard assist Gait Distance (Feet): 100 Feet Assistive device: Rolling walker (2 wheels) Gait Pattern/deviations: Step-through pattern, Decreased stride length, Trunk flexed Gait velocity: Decreased Gait velocity interpretation: <1.31 ft/sec, indicative of household ambulator   General Gait Details: VC's for improved posture, closer walker proximity and forward gaze. Very low floor clearance and pt shuffling feet along by end of gait training due to fatigue.   Stairs Stairs: Yes Stairs assistance: Min assist Stair Management: Two rails, Step to pattern, Forwards Number of Stairs: 1 (x2) General stair comments: VC's for sequencing and general safety. Pt with flexed knees in stance and significant flexed trunk with power up to next step. Assist for balance and boost to next step. Pt reports no rails at daughter's house - safety belt issued.   Wheelchair Mobility     Tilt Bed    Modified Rankin (Stroke Patients Only)       Balance Overall balance assessment: Needs assistance Sitting-balance support: Feet supported, Bilateral upper extremity supported Sitting balance-Leahy Scale: Fair  Standing balance support: Single extremity supported, Reliant on assistive  device for balance Standing balance-Leahy Scale: Poor                              Communication Communication Communication: No apparent difficulties  Cognition Arousal: Lethargic, Suspect due to medications Behavior During Therapy: Flat affect   PT - Cognitive impairments: Memory                         Following commands: Impaired Following commands impaired: Only follows one step commands consistently, Follows multi-step commands with increased time    Cueing Cueing Techniques: Verbal cues, Gestural cues  Exercises      General Comments        Pertinent Vitals/Pain Pain Assessment Pain Assessment: Faces Faces Pain Scale: Hurts even more Pain Location: Incision site Pain Descriptors / Indicators: Operative site guarding, Sore Pain Intervention(s): Limited activity within patient's tolerance, Monitored during session, Repositioned    Home Living                          Prior Function            PT Goals (current goals can now be found in the care plan section) Acute Rehab PT Goals Patient Stated Goal: Return home tomorrow at d/c PT Goal Formulation: With patient Time For Goal Achievement: 09/18/24 Potential to Achieve Goals: Good Progress towards PT goals: Progressing toward goals    Frequency    Min 5X/week      PT Plan      Co-evaluation              AM-PAC PT 6 Clicks Mobility   Outcome Measure  Help needed turning from your back to your side while in a flat bed without using bedrails?: A Little Help needed moving from lying on your back to sitting on the side of a flat bed without using bedrails?: A Little Help needed moving to and from a bed to a chair (including a wheelchair)?: A Little Help needed standing up from a chair using your arms (e.g., wheelchair or bedside chair)?: A Little Help needed to walk in hospital room?: A Little Help needed climbing 3-5 steps with a railing? : A Little 6 Click  Score: 18    End of Session Equipment Utilized During Treatment: Gait belt;Back brace Activity Tolerance: Patient tolerated treatment well Patient left: in bed;with call bell/phone within reach;with family/visitor present Nurse Communication: Mobility status PT Visit Diagnosis: Unsteadiness on feet (R26.81);Pain Pain - part of body:  (back)     Time: 0940-1000 PT Time Calculation (min) (ACUTE ONLY): 20 min  Charges:    $Gait Training: 8-22 mins PT General Charges $$ ACUTE PT VISIT: 1 Visit                     Leita Sable, PT, DPT Acute Rehabilitation Services Secure Chat Preferred Office: [phone]    Leita JONETTA Sable 09/12/2024, 10:27 AM

## 2024-09-12 NOTE — Progress Notes (Signed)
 Occupational Therapy Treatment Patient Details Name: Brian Vazquez MRN: [id number] DOB: 1951/02/22 Today's Date: 09/12/2024   History of present illness Pt is a 73 y/o male who presented 09/10/24 due to back pain. Pt s/p removal of L4 and L5 pedicle screw and rods and L3-4, L4-5 posterolateral arthrodesis. PMH significant for arthritis, chronic pain, DM, HTN, neuropathy, major depression with psychotic feature, sleep apnea on cpap, meniscus repair, TLIF L4-5 L3-4.   OT comments  Pt reported they are doing better but noted to still be lethargic in session and required cues to keep eyes open but became more alert in session. [name] reports that he will go to his daughter's house which has 3 step entrance. Pt was unable to recall 0/3 precautions but was able to done brace with min cues. Pt was able to completed LE dressing with AE with CGA to min assist and urinated using urinal with CGA and cues on position. At this time recommendation for [facility] with daughter's support with the return to home.   Bps Supine: 112/47 (63) HR 91 Sitting 89/60 (70) HR 108 Repeat sitting 99/63 (74) HR 112 Standing: 97/72 (77) HR 130 3 min standing: 128/98 (107)       If plan is discharge home, recommend the following:  A little help with walking and/or transfers;A little help with bathing/dressing/bathroom;Assistance with cooking/housework;Assist for transportation;Help with stairs or ramp for entrance   Equipment Recommendations  BSC/3in1;Hospital bed (RW)    Recommendations for Other Services      Precautions / Restrictions Precautions Precautions: Fall;Back Precaution Booklet Issued: No (in room) Recall of Precautions/Restrictions: Impaired Precaution/Restrictions Comments: Watch BP Required Braces or Orthoses: Spinal Brace (pt unable to recall any precautions) Spinal Brace: Lumbar corset;Applied in sitting position Restrictions Weight Bearing Restrictions Per Provider Order: No        Mobility Bed Mobility Overal bed mobility: Needs Assistance Bed Mobility: Rolling, Supine to Sit, Sit to Supine Rolling: Supervision Sidelying to sit: Supervision Supine to sit: Supervision Sit to supine: Supervision Sit to sidelying: Supervision General bed mobility comments: cues on log rolling    Transfers Overall transfer level: Needs assistance Equipment used: Rolling walker (2 wheels) Transfers: Sit to/from Stand Sit to Stand: Min assist           General transfer comment: VC's for hand placement on seated surface for safety. Assist for power up to full stand and when pt stadning pulling on walker even with cues     Balance Overall balance assessment: Needs assistance Sitting-balance support: Feet supported, Bilateral upper extremity supported Sitting balance-Leahy Scale: Fair     Standing balance support: Single extremity supported, Reliant on assistive device for balance Standing balance-Leahy Scale: Poor Standing balance comment: Pt able to stand in OT session but limited due to pain                           ADL either performed or assessed with clinical judgement   ADL Overall ADL's : Needs assistance/impaired Eating/Feeding: Set up;Sitting   Grooming: Set up;Sitting   Upper Body Bathing: Set up;Sitting   Lower Body Bathing: Minimal assistance;Cueing for compensatory techniques;Cueing for safety;Cueing for sequencing;With adaptive equipment   Upper Body Dressing : Set up;Standing   Lower Body Dressing: Minimal assistance;Sit to/from stand;Cueing for compensatory techniques;Cueing for sequencing;Cueing for safety;With adaptive equipment       Toileting- Clothing Manipulation and Hygiene: Contact guard assist;Sit to/from stand       Functional  mobility during ADLs: Contact guard assist;Rolling walker (2 wheels);Cueing for sequencing;Cueing for safety      Extremity/Trunk Assessment Upper Extremity Assessment Upper Extremity  Assessment: Overall WFL for tasks assessed   Lower Extremity Assessment Lower Extremity Assessment: Defer to PT evaluation        Vision   Vision Assessment?: Wears glasses for reading   Perception Perception Perception: Within Functional Limits   Praxis Praxis Praxis: WFL   Communication Communication Communication: No apparent difficulties   Cognition Arousal: Lethargic, Suspect due to medications Behavior During Therapy: WFL for tasks assessed/performed Cognition: No apparent impairments                               Following commands: Impaired Following commands impaired: Only follows one step commands consistently, Follows multi-step commands with increased time      Cueing   Cueing Techniques: Verbal cues, Gestural cues  Exercises      Shoulder Instructions       General Comments      Pertinent Vitals/ Pain       Pain Assessment Pain Assessment: 0-10 Pain Score: 5  Pain Location: Incision site Pain Descriptors / Indicators: Operative site guarding, Sore Pain Intervention(s): Monitored during session, Limited activity within patient's tolerance, Premedicated before session  Home Living                                          Prior Functioning/Environment              Frequency  Min 2X/week        Progress Toward Goals  OT Goals(current goals can now be found in the care plan section)  Progress towards OT goals: Progressing toward goals  Acute Rehab OT Goals Patient Stated Goal: to go to daughter's house OT Goal Formulation: With patient Time For Goal Achievement: 09/25/24 Potential to Achieve Goals: Fair ADL Goals Pt Will Perform Upper Body Bathing: Independently;sitting Pt Will Perform Lower Body Bathing: with modified independence;sit to/from stand Pt Will Perform Upper Body Dressing: with modified independence;sitting Pt Will Perform Lower Body Dressing: with modified independence;sit to/from  stand Pt Will Transfer to Toilet: with modified independence;ambulating  Plan      Co-evaluation                 AM-PAC OT 6 Clicks Daily Activity     Outcome Measure   Help from another person eating meals?: None Help from another person taking care of personal grooming?: None Help from another person toileting, which includes using toliet, bedpan, or urinal?: A Little Help from another person bathing (including washing, rinsing, drying)?: A Little Help from another person to put on and taking off regular upper body clothing?: None Help from another person to put on and taking off regular lower body clothing?: A Little 6 Click Score: 21    End of Session Equipment Utilized During Treatment: Gait belt;Rolling walker (2 wheels)  OT Visit Diagnosis: Unsteadiness on feet (R26.81);Other abnormalities of gait and mobility (R26.89);Repeated falls (R29.6);Muscle weakness (generalized) (M62.81);Pain Pain - part of body:  (back)   Activity Tolerance Patient limited by pain;Patient limited by lethargy   Patient Left in bed;with call bell/phone within reach   Nurse Communication Mobility status        Time: 9265-9185 OT Time Calculation (min): 40 min  Charges:  OT General Charges $OT Visit: 1 Visit OT Treatments $Self Care/Home Management : 38-52 mins  Warrick POUR OTR/L  Acute Rehab Services  [phone] office number   Warrick Berber 09/12/2024, 8:26 AM

## 2024-09-12 NOTE — Discharge Summary (Signed)
 Patient ID: Brian Vazquez MRN: [id number] DOB/AGE: 07-03-1951 73 y.o.  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 male who was admitted following a revision L4-5 fusion with extension to L3-4. They were recovered in PACU and transferred to the floor. Experienced some symptomatic hypotension postoperatively which corrected with NS bolus. Pt stable for discharge today. Pt to f/u in office for routine post op visit. Pt is in agreement w/ plan.    Discharge Exam: Blood pressure (!) 130/52, pulse (!) 105, temperature 98.8 F (37.1 C), temperature source Oral, resp. rate 17, height 6' 6 (1.981 m), weight 124.7 kg, SpO2 95%. A&O Speech fluent, appropriate Strength grossly intact BUE/BLE.  SILTx4.  Dressing c/d/I.   Disposition: Discharge disposition: 01-Home or Self Care       Discharge Instructions     Incentive spirometry RT   Complete by: As directed    Remove dressing in 48 hours   Complete by: As directed       Allergies as of 09/12/2024   No Known Allergies      Medication List     PAUSE taking these medications    aspirin EC 81 MG tablet Wait to take this until: September 17, 2024 Take 81 mg by mouth daily.       STOP taking these medications    celecoxib 200 MG capsule Commonly known as: CELEBREX       TAKE these medications    acetaminophen  500 MG tablet Commonly known as: TYLENOL  Take 1,000 mg by mouth 3 (three) times daily.   ARIPiprazole  2 MG tablet Commonly known as: ABILIFY  Take 2 mg by mouth daily.   atorvastatin  80 MG tablet Commonly known as: LIPITOR Take 80 mg by mouth at bedtime.   docusate sodium  100 MG capsule Commonly known as: COLACE Take 1 capsule (100 mg total) by mouth 2 (two) times  daily as needed for mild constipation.   DULoxetine  60 MG capsule Commonly known as: CYMBALTA  Take 60 mg by mouth daily.   ezetimibe  10 MG tablet Commonly known as: ZETIA  Take 10 mg by mouth daily.   ferrous sulfate  325 (65 FE) MG tablet Take 325 mg by mouth daily with breakfast.   fluticasone 50 MCG/ACT nasal spray Commonly known as: FLONASE Place 2 sprays into both nostrils daily as needed for allergies.   lacosamide  50 MG Tabs tablet Commonly known as: VIMPAT  Take 50 mg by mouth 2 (two) times daily.   loratadine 10 MG tablet Commonly known as: CLARITIN Take 10 mg by mouth daily.   losartan  25 MG tablet Commonly known as: COZAAR  Take 12.5 mg by mouth daily.   metFORMIN  500 MG tablet Commonly known as: GLUCOPHAGE  Take 500-1,000 mg by mouth See admin instructions. Take 1000 mg by mouth in the morning, take 500 mg at noon, and 1000 mg in the evening   methocarbamol  500 MG tablet Commonly known as: ROBAXIN  Take 1 tablet (500 mg total) by mouth every 6 (six) hours as needed for muscle spasms.   NEURIVA PLUS PO Take 1 tablet by mouth daily.   Oxycodone  HCl 10 MG Tabs Take 1 tablet (10 mg total) by mouth every 4 (four) hours as needed for severe pain (pain score 7-10). What changed: reasons to take this   pantoprazole  40 MG tablet Commonly known as: PROTONIX   Take 40 mg by mouth daily.   sennosides-docusate sodium  8.6-50 MG tablet Commonly known as: SENOKOT-S Take 1 tablet by mouth in the morning and at bedtime.   tadalafil 20 MG tablet Commonly known as: CIALIS Take 20 mg by mouth daily as needed for erectile dysfunction.   tamsulosin  0.4 MG Caps capsule Commonly known as: FLOMAX  Take 0.4 mg by mouth at bedtime.   Vitamin D3 50 MCG Caps Take 2,000 Units by mouth daily.               Durable Medical Equipment  (From admission, onward)           Start     Ordered   09/11/24 1434  For home use only DME Bedside commode  Once       Question:   Patient needs a bedside commode to treat with the following condition  Answer:  S/P lumbar fusion   09/11/24 1434   09/11/24 1426  For home use only DME Hospital bed  Once       Question Answer Comment  Length of Need 12 Months   Patient has (list medical condition): revision and extesion of Lumbar fusion L3-5   The above medical condition requires: Patient requires the ability to reposition frequently   Head must be elevated greater than: 30 degrees   Bed type Semi-electric      09/11/24 1426   09/11/24 1416  For home use only DME Walker rolling  Once       Comments: Bariatric RW. Pt 6'6 wgt 275 lbs  Question Answer Comment  Walker: With 5 Inch Wheels   Patient needs a walker to treat with the following condition S/P lumbar fusion      09/11/24 1426            Follow-up Information     [facility] Follow up.   Specialty: Home Health Services Why: Someone from [facility] will contact you to arrange start date and time for your Home Health physical therapy Contact information: [address] [place] [address] [phone]                 Signed: Miliyah Luper CAYLIN Norena Bratton 09/12/2024, 10:41 AM

## 2024-09-12 NOTE — Plan of Care (Signed)
  Problem: Education: Goal: Knowledge of General Education information will improve Description: Including pain rating scale, medication(s)/side effects and non-pharmacologic comfort measures Outcome: Completed/Met   Problem: Health Behavior/Discharge Planning: Goal: Ability to manage health-related needs will improve Outcome: Completed/Met   Problem: Clinical Measurements: Goal: Ability to maintain clinical measurements within normal limits will improve Outcome: Completed/Met Goal: Will remain free from infection Outcome: Completed/Met Goal: Diagnostic test results will improve Outcome: Completed/Met Goal: Respiratory complications will improve Outcome: Completed/Met Goal: Cardiovascular complication will be avoided Outcome: Completed/Met   Problem: Activity: Goal: Risk for activity intolerance will decrease Outcome: Completed/Met   Problem: Nutrition: Goal: Adequate nutrition will be maintained Outcome: Completed/Met   Problem: Coping: Goal: Level of anxiety will decrease Outcome: Completed/Met   Problem: Elimination: Goal: Will not experience complications related to bowel motility Outcome: Completed/Met Goal: Will not experience complications related to urinary retention Outcome: Completed/Met   Problem: Pain Managment: Goal: General experience of comfort will improve and/or be controlled Outcome: Completed/Met   Problem: Safety: Goal: Ability to remain free from injury will improve Outcome: Completed/Met   Problem: Skin Integrity: Goal: Risk for impaired skin integrity will decrease Outcome: Completed/Met   Problem: Education: Goal: Ability to verbalize activity precautions or restrictions will improve Outcome: Completed/Met Goal: Knowledge of the prescribed therapeutic regimen will improve Outcome: Completed/Met Goal: Understanding of discharge needs will improve Outcome: Completed/Met   Problem: Activity: Goal: Ability to avoid complications of  mobility impairment will improve Outcome: Completed/Met Goal: Ability to tolerate increased activity will improve Outcome: Completed/Met Goal: Will remain free from falls Outcome: Completed/Met   Problem: Bowel/Gastric: Goal: Gastrointestinal status for postoperative course will improve Outcome: Completed/Met   Problem: Clinical Measurements: Goal: Ability to maintain clinical measurements within normal limits will improve Outcome: Completed/Met Goal: Postoperative complications will be avoided or minimized Outcome: Completed/Met Goal: Diagnostic test results will improve Outcome: Completed/Met   Problem: Pain Management: Goal: Pain level will decrease Outcome: Completed/Met   Problem: Skin Integrity: Goal: Will show signs of wound healing Outcome: Completed/Met   Problem: Health Behavior/Discharge Planning: Goal: Identification of resources available to assist in meeting health care needs will improve Outcome: Completed/Met   Problem: Bladder/Genitourinary: Goal: Urinary functional status for postoperative course will improve Outcome: Completed/Met   Problem: Education: Goal: Ability to describe self-care measures that may prevent or decrease complications (Diabetes Survival Skills Education) will improve Outcome: Completed/Met Goal: Individualized Educational Video(s) Outcome: Completed/Met   Problem: Coping: Goal: Ability to adjust to condition or change in health will improve Outcome: Completed/Met   Problem: Fluid Volume: Goal: Ability to maintain a balanced intake and output will improve Outcome: Completed/Met   Problem: Health Behavior/Discharge Planning: Goal: Ability to identify and utilize available resources and services will improve Outcome: Completed/Met Goal: Ability to manage health-related needs will improve Outcome: Completed/Met   Problem: Metabolic: Goal: Ability to maintain appropriate glucose levels will improve Outcome: Completed/Met    Problem: Nutritional: Goal: Maintenance of adequate nutrition will improve Outcome: Completed/Met Goal: Progress toward achieving an optimal weight will improve Outcome: Completed/Met   Problem: Skin Integrity: Goal: Risk for impaired skin integrity will decrease Outcome: Completed/Met   Problem: Tissue Perfusion: Goal: Adequacy of tissue perfusion will improve Outcome: Completed/Met

## 2024-09-12 NOTE — TOC Progression Note (Signed)
 Transition of Care [facility]) - Progression Note    Patient Details  Name: Brian Vazquez MRN: [id number] Date of Birth: 1951/04/13  Transition of Care [facility]) CM/SW Contact  Andrez JULIANNA George, RN Phone Number: 09/12/2024, 10:10 AM  Clinical Narrative:     Forms sent to [facility] for DME and requested they be sent STAT.  IP Care management following.  Expected Discharge Plan: Home w Home Health Services Barriers to Discharge: Continued Medical Work up               Expected Discharge Plan and Services   Discharge Planning Services: CM Consult Post Acute Care Choice: Durable Medical Equipment, Home Health Living arrangements for the past 2 months: Single Family Home                           HH Arranged: PT, OT [facility] Agency: [facility] Date [facility] Agency Contacted: 09/11/24 Time HH Agency Contacted: [id number] Representative spoke with at [facility] Agency: Darleene Gowda   Social Drivers of Health (SDOH) Interventions SDOH Screenings   Social Connections: Unknown (05/13/2022)   Received from [facility]  Tobacco Use: Medium Risk (09/10/2024)    Readmission Risk Interventions     No data to display

## 2024-09-13 ENCOUNTER — Encounter (HOSPITAL_COMMUNITY): Payer: Self-pay | Admitting: Neurosurgery

## 2024-09-14 LAB — BPAM RBC
Blood Product Expiration Date: 202510102359
Blood Product Expiration Date: 202510102359
Unit Type and Rh: 6200
Unit Type and Rh: 6200

## 2024-09-14 LAB — TYPE AND SCREEN
ABO/RH(D): A POS
Antibody Screen: NEGATIVE
Unit division: 0
Unit division: 0

## 2024-12-16 ENCOUNTER — Other Ambulatory Visit (HOSPITAL_COMMUNITY): Payer: Self-pay
